# Patient Record
Sex: Female | Born: 1945 | Race: White | Hispanic: No | Marital: Married | Smoking: Never smoker
Health system: Southern US, Community
[De-identification: ages and names within clinical notes are randomized; demographics above are authoritative.]

## PROBLEM LIST (undated history)

## (undated) DIAGNOSIS — K219 Gastro-esophageal reflux disease without esophagitis: Secondary | ICD-10-CM

## (undated) DIAGNOSIS — I739 Peripheral vascular disease, unspecified: Secondary | ICD-10-CM

## (undated) DIAGNOSIS — I10 Essential (primary) hypertension: Secondary | ICD-10-CM

## (undated) DIAGNOSIS — E079 Disorder of thyroid, unspecified: Secondary | ICD-10-CM

## (undated) HISTORY — PX: JOINT REPLACEMENT: SHX530

## (undated) HISTORY — PX: KNEE ARTHROSCOPY: SHX127

## (undated) HISTORY — PX: ROTATOR CUFF REPAIR: SHX139

## (undated) HISTORY — PX: TUBAL LIGATION: SHX77

## (undated) HISTORY — DX: Disorder of thyroid, unspecified: E07.9

## (undated) HISTORY — DX: Peripheral vascular disease, unspecified: I73.9

---

## 2004-03-21 ENCOUNTER — Ambulatory Visit: Payer: Self-pay | Admitting: General Surgery

## 2004-05-04 ENCOUNTER — Ambulatory Visit: Payer: Self-pay

## 2004-06-16 ENCOUNTER — Ambulatory Visit: Payer: Self-pay

## 2005-06-18 ENCOUNTER — Ambulatory Visit: Payer: Self-pay | Admitting: Unknown Physician Specialty

## 2005-12-11 ENCOUNTER — Ambulatory Visit: Payer: Self-pay | Admitting: Obstetrics and Gynecology

## 2006-01-11 ENCOUNTER — Ambulatory Visit: Payer: Self-pay | Admitting: Unknown Physician Specialty

## 2006-12-26 ENCOUNTER — Ambulatory Visit: Payer: Self-pay | Admitting: Obstetrics and Gynecology

## 2007-12-25 ENCOUNTER — Ambulatory Visit: Payer: Self-pay | Admitting: Obstetrics and Gynecology

## 2009-02-09 ENCOUNTER — Ambulatory Visit: Payer: Self-pay | Admitting: Obstetrics and Gynecology

## 2010-02-10 ENCOUNTER — Ambulatory Visit: Payer: Self-pay | Admitting: Family Medicine

## 2011-03-12 ENCOUNTER — Ambulatory Visit: Payer: Self-pay | Admitting: Family Medicine

## 2012-04-24 ENCOUNTER — Ambulatory Visit: Payer: Self-pay | Admitting: Internal Medicine

## 2013-04-27 ENCOUNTER — Ambulatory Visit: Payer: Self-pay | Admitting: Internal Medicine

## 2013-09-08 DIAGNOSIS — M1990 Unspecified osteoarthritis, unspecified site: Secondary | ICD-10-CM | POA: Insufficient documentation

## 2014-01-01 ENCOUNTER — Ambulatory Visit: Payer: Self-pay | Admitting: Gastroenterology

## 2014-04-30 ENCOUNTER — Ambulatory Visit: Payer: Self-pay | Admitting: Internal Medicine

## 2015-03-14 ENCOUNTER — Other Ambulatory Visit: Payer: Self-pay | Admitting: Internal Medicine

## 2015-03-14 DIAGNOSIS — R1031 Right lower quadrant pain: Secondary | ICD-10-CM

## 2015-03-25 ENCOUNTER — Ambulatory Visit
Admission: RE | Admit: 2015-03-25 | Discharge: 2015-03-25 | Disposition: A | Discharge status: Home / Self Care | Payer: Medicare Other | Source: Ambulatory Visit | Attending: Internal Medicine | Admitting: Internal Medicine

## 2015-03-25 DIAGNOSIS — R102 Pelvic and perineal pain: Secondary | ICD-10-CM | POA: Diagnosis present

## 2015-03-25 DIAGNOSIS — R1031 Right lower quadrant pain: Secondary | ICD-10-CM | POA: Diagnosis not present

## 2015-03-25 DIAGNOSIS — I7090 Unspecified atherosclerosis: Secondary | ICD-10-CM | POA: Diagnosis not present

## 2015-03-25 MED ORDER — IOHEXOL 300 MG/ML  SOLN: 100.0000 mL | Freq: Once | INTRAMUSCULAR | Status: DC | PRN

## 2015-04-26 ENCOUNTER — Other Ambulatory Visit: Payer: Self-pay | Admitting: Internal Medicine

## 2015-04-27 ENCOUNTER — Other Ambulatory Visit: Payer: Self-pay | Admitting: Internal Medicine

## 2015-04-27 DIAGNOSIS — Z1231 Encounter for screening mammogram for malignant neoplasm of breast: Secondary | ICD-10-CM

## 2015-05-02 ENCOUNTER — Ambulatory Visit
Admission: RE | Admit: 2015-05-02 | Discharge: 2015-05-02 | Disposition: A | Discharge status: Home / Self Care | Payer: Medicare Other | Source: Ambulatory Visit | Attending: Internal Medicine | Admitting: Internal Medicine

## 2015-05-02 ENCOUNTER — Other Ambulatory Visit: Payer: Self-pay | Admitting: Internal Medicine

## 2015-05-02 DIAGNOSIS — Z1231 Encounter for screening mammogram for malignant neoplasm of breast: Secondary | ICD-10-CM | POA: Insufficient documentation

## 2015-05-03 ENCOUNTER — Other Ambulatory Visit: Payer: Self-pay | Admitting: Internal Medicine

## 2015-05-03 DIAGNOSIS — R928 Other abnormal and inconclusive findings on diagnostic imaging of breast: Secondary | ICD-10-CM

## 2015-05-09 ENCOUNTER — Ambulatory Visit
Admission: RE | Admit: 2015-05-09 | Discharge: 2015-05-09 | Disposition: A | Discharge status: Home / Self Care | Payer: Medicare Other | Source: Ambulatory Visit | Attending: Internal Medicine | Admitting: Internal Medicine

## 2015-05-09 DIAGNOSIS — R928 Other abnormal and inconclusive findings on diagnostic imaging of breast: Secondary | ICD-10-CM | POA: Diagnosis not present

## 2016-04-17 ENCOUNTER — Other Ambulatory Visit: Payer: Self-pay | Admitting: Internal Medicine

## 2016-04-17 DIAGNOSIS — Z1231 Encounter for screening mammogram for malignant neoplasm of breast: Secondary | ICD-10-CM

## 2016-05-16 ENCOUNTER — Ambulatory Visit
Admission: RE | Admit: 2016-05-16 | Discharge: 2016-05-16 | Disposition: A | Discharge status: Home / Self Care | Payer: Medicare Other | Source: Ambulatory Visit | Attending: Internal Medicine | Admitting: Internal Medicine

## 2016-05-16 DIAGNOSIS — Z1231 Encounter for screening mammogram for malignant neoplasm of breast: Secondary | ICD-10-CM | POA: Diagnosis present

## 2017-06-28 ENCOUNTER — Other Ambulatory Visit: Payer: Self-pay | Admitting: Internal Medicine

## 2017-06-28 DIAGNOSIS — Z1231 Encounter for screening mammogram for malignant neoplasm of breast: Secondary | ICD-10-CM

## 2017-08-06 ENCOUNTER — Ambulatory Visit
Admission: RE | Admit: 2017-08-06 | Discharge: 2017-08-06 | Disposition: A | Discharge status: Home / Self Care | Payer: Medicare Other | Source: Ambulatory Visit | Attending: Internal Medicine | Admitting: Internal Medicine

## 2017-08-06 DIAGNOSIS — Z1231 Encounter for screening mammogram for malignant neoplasm of breast: Secondary | ICD-10-CM | POA: Diagnosis present

## 2018-01-17 ENCOUNTER — Ambulatory Visit (INDEPENDENT_AMBULATORY_CARE_PROVIDER_SITE_OTHER): Payer: Medicare Other | Admitting: Vascular Surgery

## 2018-01-17 ENCOUNTER — Encounter (INDEPENDENT_AMBULATORY_CARE_PROVIDER_SITE_OTHER): Payer: Self-pay | Admitting: Vascular Surgery

## 2018-01-17 DIAGNOSIS — I83813 Varicose veins of bilateral lower extremities with pain: Secondary | ICD-10-CM

## 2018-01-17 DIAGNOSIS — I739 Peripheral vascular disease, unspecified: Secondary | ICD-10-CM | POA: Diagnosis not present

## 2018-01-17 NOTE — Assessment & Plan Note (Signed)
Recommend:  The patient has large symptomatic varicose veins that are painful and associated with swelling.  I have had a long discussion with the patient regarding  varicose veins and why they cause symptoms.  Patient will begin wearing graduated compression stockings class 1 on a daily basis, beginning first thing in the morning and removing them in the evening. The patient is instructed specifically not to sleep in the stockings.    The patient  will also begin using over-the-counter analgesics such as Motrin 600 mg po TID to help control the symptoms.    In addition, behavioral modification including elevation during the day will be initiated.    Pending the results of these changes the  patient will be reevaluated allowing her studies.   An  ultrasound of the venous system will be obtained.   Further plans will be based on the ultrasound results and whether conservative therapies are successful at eliminating the pain and swelling.

## 2018-01-17 NOTE — Progress Notes (Signed)
Patient ID: Ann Crawford, female   DOB: [birth year], 72 y.o.   MRN: [id number]  Chief Complaint  Patient presents with  . Follow-up    Bilateral painful legs     HPI Ann Crawford is a 72 y.o. female.  Patient present for evaluation of painful legs.  The patient has had increasing varicosities over several years time.  For many years, her legs were not painful or swollen.  She has noticed several episodes where the legs become heavy and tired.  Last week, she had an episode where she had severe pain on the inside of her right leg starting in her thigh and radiating down towards the foot.  After several days and using anti-inflammatories and aspirin, the pain went away.  She had an aunt who had a DVT and other family members who have had venous disease so she was very concerned about this.  She does not describe claudication symptoms.  She has no history of ulceration or infection.  The right leg is the more severely affected of the 2 legs.  There is no clear inciting event or causative factor that started the symptoms.  Current Outpatient Medications  Medication Sig Dispense Refill  . aspirin 81 MG chewable tablet Chew by mouth daily.    . fluticasone (FLONASE) 50 MCG/ACT nasal spray Place into both nostrils daily.    Marland Kitchen glucosamine-chondroitin 500-400 MG tablet Take 1 tablet by mouth 3 (three) times daily.    Marland Kitchen levothyroxine (SYNTHROID, LEVOTHROID) 50 MCG tablet Take by mouth.    . Na Sulfate-K Sulfate-Mg Sulf 17.5-3.13-1.6 GM/177ML SOLN Take as directed for colon prep.    . Selenium 200 MCG CAPS Take by mouth.     No current facility-administered medications for this visit.      Past Medical History:  Diagnosis Date  . Peripheral arterial disease (HCC)   . Thyroid disease     Past Surgical History:  Procedure Laterality Date  . JOINT REPLACEMENT    . TUBAL LIGATION      Family History  Problem Relation Age of Onset  . Hypertension Mother   . Varicose Veins Paternal  Aunt   . Breast cancer Neg Hx   DVT in a paternal aunt No bleeding disorders  Social History Social History   Tobacco Use  . Smoking status: Never Smoker  . Smokeless tobacco: Never Used  Substance Use Topics  . Alcohol use: Yes  . Drug use: Not on file    Allergies  Allergen Reactions  . Latex Rash        REVIEW OF SYSTEMS (Negative unless checked)  Constitutional: [] Weight loss  [] Fever  [] Chills Cardiac: [] Chest pain   [] Chest pressure   [] Palpitations   [] Shortness of breath when laying flat   [] Shortness of breath at rest   [] Shortness of breath with exertion. Vascular:  [x] Pain in legs with walking   [x] Pain in legs at rest   [] Pain in legs when laying flat   [] Claudication   [] Pain in feet when walking  [] Pain in feet at rest  [] Pain in feet when laying flat   [] History of DVT   [] Phlebitis   [x] Swelling in legs   [x] Varicose veins   [] Non-healing ulcers Pulmonary:   [] Uses home oxygen   [] Productive cough   [] Hemoptysis   [] Wheeze  [] COPD   [] Asthma Neurologic:  [] Dizziness  [] Blackouts   [] Seizures   [] History of stroke   [] History of TIA  [] Aphasia   [] Temporary  blindness   [] Dysphagia   [] Weakness or numbness in arms   [] Weakness or numbness in legs Musculoskeletal:  [] Arthritis   [] Joint swelling   [] Joint pain   [] Low back pain Hematologic:  [] Easy bruising  [] Easy bleeding   [] Hypercoagulable state   [] Anemic  [] Hepatitis  Gastrointestinal:  [] Blood in stool   [] Vomiting blood  [] Gastroesophageal reflux/heartburn   [] Difficulty swallowing   Genitourinary:  [] Chronic kidney disease   [] Difficult urination  [] Frequent urination  [] Burning with urination   [] Blood in urine Skin:  [] Rashes   [] Ulcers   [] Wounds Psychological:  [] History of anxiety   []  History of major depression.  Physical Exam BP (!) 149/85 (BP Location: Right Arm, Patient Position: Sitting)   Pulse 89   Resp 17   Ht 5\' 3"  (1.6 m)   Wt 159 lb (72.1 kg)   BMI 28.17 kg/m   Gen:  WD/WN, NAD.   Appears younger than stated age Head: [place]/AT, No temporalis wasting.  Ear/Nose/Throat: Hearing grossly intact, nares w/o erythema or drainage, oropharynx w/o Erythema/Exudate Eyes: Sclera non-icteric, conjunctiva clear Neck: Trachea midline.  No JVD.  Pulmonary:  Good air movement, no use of accessory muscles.  Cardiac: RRR, normal S1, S2. Vascular:  Vessel Right Left  Radial Palpable Palpable                          PT  1+ palpable  1+ palpable  DP Palpable Palpable    Musculoskeletal: M/S 5/5 throughout.  Extremities without ischemic changes.  No deformity or atrophy.  Trace right lower extremity edema. Varicosities diffuse bilaterally a little worse on the right than the left.  The largest varicosities are in the 2 mm range Neurologic:  Sensation grossly intact in extremities.  Symmetrical.  Speech is fluent. Motor exam as listed above. Psychiatric: Judgment intact, Mood & affect appropriate for pt's clinical situation. Dermatologic: No rashes or ulcers noted.  No cellulitis or open wounds.   Radiology No results found.  Labs No results found for this or any previous visit (from the past 2160 hour(s)).  Assessment/Plan:  Peripheral arterial disease (HCC) This is listed as 1 of her medical problems.  Her symptoms are not really consistent with peripheral arterial disease and are more worrisome for venous disease.  Varicose veins of leg with pain, bilateral  Recommend:  The patient has large symptomatic varicose veins that are painful and associated with swelling.  I have had a long discussion with the patient regarding  varicose veins and why they cause symptoms.  Patient will begin wearing graduated compression stockings class 1 on a daily basis, beginning first thing in the morning and removing them in the evening. The patient is instructed specifically not to sleep in the stockings.    The patient  will also begin using over-the-counter analgesics such as Motrin 600  mg po TID to help control the symptoms.    In addition, behavioral modification including elevation during the day will be initiated.    Pending the results of these changes the  patient will be reevaluated allowing her studies.   An  ultrasound of the venous system will be obtained.   Further plans will be based on the ultrasound results and whether conservative therapies are successful at eliminating the pain and swelling.      Festus Barren 01/17/2018, 4:35 PM   This note was created with [facility] medical dictation system.  Any errors from dictation are unintentional.

## 2018-01-17 NOTE — Assessment & Plan Note (Signed)
This is listed as 1 of her medical problems.  Her symptoms are not really consistent with peripheral arterial disease and are more worrisome for venous disease.

## 2018-01-17 NOTE — Patient Instructions (Signed)

## 2018-02-12 ENCOUNTER — Ambulatory Visit (INDEPENDENT_AMBULATORY_CARE_PROVIDER_SITE_OTHER): Payer: Medicare Other | Admitting: Nurse Practitioner

## 2018-02-12 ENCOUNTER — Encounter (INDEPENDENT_AMBULATORY_CARE_PROVIDER_SITE_OTHER): Payer: Self-pay | Admitting: Nurse Practitioner

## 2018-02-12 ENCOUNTER — Ambulatory Visit (INDEPENDENT_AMBULATORY_CARE_PROVIDER_SITE_OTHER): Payer: Medicare Other

## 2018-02-12 VITALS — BP 149/84 | HR 78 | Resp 16 | Ht 63.0 in | Wt 160.0 lb

## 2018-02-12 DIAGNOSIS — I83813 Varicose veins of bilateral lower extremities with pain: Secondary | ICD-10-CM

## 2018-02-12 DIAGNOSIS — I739 Peripheral vascular disease, unspecified: Secondary | ICD-10-CM | POA: Diagnosis not present

## 2018-02-12 DIAGNOSIS — M1710 Unilateral primary osteoarthritis, unspecified knee: Secondary | ICD-10-CM | POA: Diagnosis not present

## 2018-02-13 ENCOUNTER — Encounter (INDEPENDENT_AMBULATORY_CARE_PROVIDER_SITE_OTHER): Payer: Self-pay | Admitting: Nurse Practitioner

## 2018-02-13 NOTE — Progress Notes (Signed)
Subjective:    Patient ID: Ann Crawford, female    DOB: [birth year], 72 y.o.   MRN: [id number] Chief Complaint  Patient presents with  . Follow-up    ultrasound follow up    HPI  Ann Crawford is a 72 y.o. female is following up today for concerns of increasing varicose veins with pain in the lateral aspect of her right thigh.  She states that the pain starts in her thigh radiates down for several days and it is controlled with aspirin.  Patient was also concerned due to the fact that multiple family members have had DVTs in the past.  She denies any claudication-like symptoms or rest pain.  She denies any ulcerations or wounds on her bilateral lower extremities.  There is no history of venous or peripheral vascular intervention.  She denies any fever, chills, nausea, vomiting.  There is a history of osteoarthritis in the hip and knee.  She denies any chest pain or shortness of breath.  The patient underwent a bilateral lower venous reflux study today which revealed no evidence of DVT in the bilateral lower extremities.  There is no evidence of chronic venous insufficiency bilaterally.  There is no evidence of superficial venous thrombosis bilaterally.  Past Medical History:  Diagnosis Date  . Peripheral arterial disease (HCC)   . Thyroid disease     Past Surgical History:  Procedure Laterality Date  . JOINT REPLACEMENT    . TUBAL LIGATION      Social History   Socioeconomic History  . Marital status: Married    Spouse name: Not on file  . Number of children: Not on file  . Years of education: Not on file  . Highest education level: Not on file  Occupational History  . Not on file  Social Needs  . Financial resource strain: Not on file  . Food insecurity:    Worry: Not on file    Inability: Not on file  . Transportation needs:    Medical: Not on file    Non-medical: Not on file  Tobacco Use  . Smoking status: Never Smoker  . Smokeless tobacco: Never Used    Substance and Sexual Activity  . Alcohol use: Yes  . Drug use: Not on file  . Sexual activity: Not on file  Lifestyle  . Physical activity:    Days per week: Not on file    Minutes per session: Not on file  . Stress: Not on file  Relationships  . Social connections:    Talks on phone: Not on file    Gets together: Not on file    Attends religious service: Not on file    Active member of club or organization: Not on file    Attends meetings of clubs or organizations: Not on file    Relationship status: Not on file  . Intimate partner violence:    Fear of current or ex partner: Not on file    Emotionally abused: Not on file    Physically abused: Not on file    Forced sexual activity: Not on file  Other Topics Concern  . Not on file  Social History Narrative  . Not on file    Family History  Problem Relation Age of Onset  . Hypertension Mother   . Varicose Veins Paternal Aunt   . Breast cancer Neg Hx     Allergies  Allergen Reactions  . Latex Rash     Review of Systems  Review of Systems: Negative Unless Checked Constitutional: [] Weight loss  [] Fever  [] Chills Cardiac: [] Chest pain   []  Atrial Fibrillation  [] Palpitations   [] Shortness of breath when laying flat   [] Shortness of breath with exertion. Vascular:  [] Pain in legs with walking   [] Pain in legs with standing  [] History of DVT   [] Phlebitis   [x] Swelling in legs   [x] Varicose veins   [] Non-healing ulcers Pulmonary:   [] Uses home oxygen   [] Productive cough   [] Hemoptysis   [] Wheeze  [] COPD   [] Asthma Neurologic:  [] Dizziness   [] Seizures   [] History of stroke   [] History of TIA  [] Aphasia   [] Vissual changes   [] Weakness or numbness in arm   [] Weakness or numbness in leg Musculoskeletal:   [] Joint swelling   [x] Joint pain   [] Low back pain  []  History of Knee Replacement Hematologic:  [] Easy bruising  [] Easy bleeding   [] Hypercoagulable state   [] Anemic Gastrointestinal:  [] Diarrhea   [] Vomiting   [] Gastroesophageal reflux/heartburn   [] Difficulty swallowing. Genitourinary:  [] Chronic kidney disease   [] Difficult urination  [] Anuric   [] Blood in urine Skin:  [] Rashes   [] Ulcers  Psychological:  [] History of anxiety   []  History of major depression  []  Memory Difficulties     Objective:   Physical Exam  BP (!) 149/84 (BP Location: Right Arm)   Pulse 78   Resp 16   Ht 5\' 3"  (1.6 m)   Wt 160 lb (72.6 kg)   BMI 28.34 kg/m   Gen: WD/WN, NAD Head: [place]/AT, No temporalis wasting.  Ear/Nose/Throat: Hearing grossly intact, nares w/o erythema or drainage Eyes: PER, EOMI, sclera nonicteric.  Neck: Supple, no masses.  No JVD.  Pulmonary:  Good air movement, no use of accessory muscles.  Cardiac: RRR Vascular:  Audible scattered varicosities on the bilateral lower extremities Vessel Right Left  Radial Palpable Palpable  Dorsalis Pedis Palpable Palpable  Posterior Tibial Palpable Palpable   Gastrointestinal: soft, non-distended. No guarding/no peritoneal signs.  Musculoskeletal: M/S 5/5 throughout.  No deformity or atrophy.  Neurologic: Pain and light touch intact in extremities.  Symmetrical.  Speech is fluent. Motor exam as listed above. Psychiatric: Judgment intact, Mood & affect appropriate for pt's clinical situation. Dermatologic: No Venous rashes. No Ulcers Noted.  No changes consistent with cellulitis. Lymph : No Cervical lymphadenopathy, no lichenification or skin changes of chronic lymphedema.      Assessment & Plan:    1. Varicose veins of leg with pain, bilateral The patient underwent a bilateral lower venous reflux study today which revealed no evidence of DVT in the bilateral lower extremities.  There is no evidence of chronic venous insufficiency bilaterally.  There is no evidence of superficial venous thrombosis bilaterally.  Recommend:  The patient is complaining of varicose veins.    I have had a long discussion with the patient regarding  varicose veins and  why they cause symptoms.  Patient will begin wearing graduated compression stockings on a daily basis, beginning first thing in the morning and removing them in the evening. The patient is instructed specifically not to sleep in the stockings.    The patient  will also begin using over-the-counter analgesics such as Motrin 600 mg po TID to help control the symptoms as needed.    In addition, behavioral modification including elevation during the day will be initiated, utilizing a recliner was recommended.  The patient is also instructed to continue exercising such as walking 4-5 times per week.  At this time the patient wishes to continue conservative therapy and is not interested in more invasive treatments such as laser ablation and sclerotherapy.  The Patient will follow up PRN if the symptoms worsen.  2. Peripheral arterial disease (HCC) The patient does not have pain and symptoms significant peripheral vascular disease.  The patient also does not have a risk factor for peripheral vascular disease.  3. Primary osteoarthritis of knee, unspecified laterality Continue NSAID medications as already ordered, these medications have been reviewed and there are no changes at this time.  Continued activity and therapy was stressed.    Current Outpatient Medications on File Prior to Visit  Medication Sig Dispense Refill  . aspirin 81 MG chewable tablet Chew by mouth daily.    . fluticasone (FLONASE) 50 MCG/ACT nasal spray Place into both nostrils daily.    Marland Kitchen glucosamine-chondroitin 500-400 MG tablet Take 1 tablet by mouth 3 (three) times daily.    Marland Kitchen levothyroxine (SYNTHROID, LEVOTHROID) 50 MCG tablet Take by mouth.    . Na Sulfate-K Sulfate-Mg Sulf 17.5-3.13-1.6 GM/177ML SOLN Take as directed for colon prep.    . Selenium 200 MCG CAPS Take by mouth.     No current facility-administered medications on file prior to visit.     There are no Patient Instructions on file for this visit. Return  if symptoms worsen or fail to improve.   Georgiana Spinner, NP  This note was completed with Office manager.  Any errors are purely unintentional.

## 2018-04-03 ENCOUNTER — Other Ambulatory Visit: Payer: Self-pay | Admitting: Internal Medicine

## 2018-04-03 DIAGNOSIS — R1084 Generalized abdominal pain: Secondary | ICD-10-CM

## 2018-04-11 ENCOUNTER — Ambulatory Visit
Admission: RE | Admit: 2018-04-11 | Discharge: 2018-04-11 | Disposition: A | Discharge status: Home / Self Care | Payer: Medicare Other | Source: Ambulatory Visit | Attending: Internal Medicine | Admitting: Internal Medicine

## 2018-04-11 DIAGNOSIS — R1084 Generalized abdominal pain: Secondary | ICD-10-CM | POA: Diagnosis present

## 2018-12-10 ENCOUNTER — Other Ambulatory Visit: Payer: Self-pay | Admitting: Student

## 2018-12-10 DIAGNOSIS — R112 Nausea with vomiting, unspecified: Secondary | ICD-10-CM

## 2018-12-18 ENCOUNTER — Ambulatory Visit
Admission: RE | Admit: 2018-12-18 | Discharge: 2018-12-18 | Disposition: A | Discharge status: Home / Self Care | Payer: Medicare Other | Source: Ambulatory Visit | Attending: Student | Admitting: Student

## 2018-12-18 ENCOUNTER — Other Ambulatory Visit: Payer: Self-pay

## 2018-12-18 DIAGNOSIS — R112 Nausea with vomiting, unspecified: Secondary | ICD-10-CM

## 2018-12-18 MED ORDER — TECHNETIUM TC 99M MEBROFENIN IV KIT
5.2300 | PACK | Freq: Once | INTRAVENOUS | Status: AC | PRN
  Administered 2018-12-18: 5.23 via INTRAVENOUS

## 2018-12-23 ENCOUNTER — Other Ambulatory Visit: Payer: Self-pay | Admitting: Student

## 2018-12-23 DIAGNOSIS — R112 Nausea with vomiting, unspecified: Secondary | ICD-10-CM

## 2018-12-25 ENCOUNTER — Other Ambulatory Visit: Payer: Self-pay

## 2018-12-25 ENCOUNTER — Encounter
Admission: RE | Admit: 2018-12-25 | Discharge: 2018-12-25 | Disposition: A | Discharge status: Home / Self Care | Payer: Medicare Other | Source: Ambulatory Visit | Attending: Student | Admitting: Student

## 2018-12-25 DIAGNOSIS — R112 Nausea with vomiting, unspecified: Secondary | ICD-10-CM | POA: Insufficient documentation

## 2018-12-25 MED ORDER — TECHNETIUM TC 99M SULFUR COLLOID
2.7900 | Freq: Once | INTRAVENOUS | Status: AC | PRN
  Administered 2018-12-25: 2.79 via INTRAVENOUS

## 2019-02-12 ENCOUNTER — Other Ambulatory Visit: Payer: Self-pay | Admitting: Internal Medicine

## 2019-02-12 DIAGNOSIS — Z1231 Encounter for screening mammogram for malignant neoplasm of breast: Secondary | ICD-10-CM

## 2019-05-11 ENCOUNTER — Ambulatory Visit
Admission: RE | Admit: 2019-05-11 | Discharge: 2019-05-11 | Disposition: A | Discharge status: Home / Self Care | Payer: Medicare Other | Source: Ambulatory Visit | Attending: Internal Medicine | Admitting: Internal Medicine

## 2019-05-11 DIAGNOSIS — Z1231 Encounter for screening mammogram for malignant neoplasm of breast: Secondary | ICD-10-CM | POA: Diagnosis not present

## 2020-07-08 ENCOUNTER — Other Ambulatory Visit: Payer: Self-pay | Admitting: Internal Medicine

## 2020-07-08 DIAGNOSIS — Z1231 Encounter for screening mammogram for malignant neoplasm of breast: Secondary | ICD-10-CM

## 2020-07-18 ENCOUNTER — Inpatient Hospital Stay: Admission: RE | Admit: 2020-07-18 | Payer: Medicare Other | Source: Ambulatory Visit

## 2020-07-26 IMAGING — NM NM GASTRIC EMPTYING
1 series · 10 of 10 positions shown · non-contrast
Comparison: None

CLINICAL DATA: Nausea, vomiting, bloating, and postprandial
fullness, occasional abdominal pain

EXAM:
NUCLEAR MEDICINE GASTRIC EMPTYING SCAN
TECHNIQUE: After oral ingestion of radiolabeled exam 4 ounces of water,
sequential abdominal images were obtained for 4 hours. Patient did
not eat the full standardized meal, did not consume the toast.
Percentage of activity emptying the stomach was calculated at 1
hour, 2 hour, 3 hour, and 4 hours.
RADIOPHARMACEUTICALS:  2.79 mCi Tc-66m sulfur colloid in
standardized meal

[Series 1000: gatric statics (results) · 3.90mm/px · 5 acquisitions, 10 frames shown]
[im 1/5]
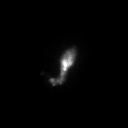
[im 1/5]
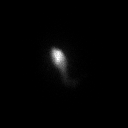
[im 2/5]
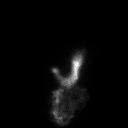
[im 2/5]
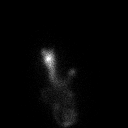
[im 3/5]
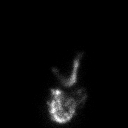
[im 3/5]
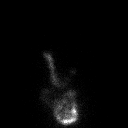
[im 4/5]
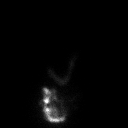
[im 4/5]
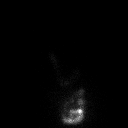
[im 5/5]
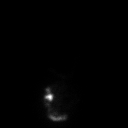
[im 5/5]
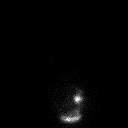

[10 of 10 positions shown; findings below may reference images not displayed]

FINDINGS: Expected location of the stomach in the left upper quadrant.

Ingested meal empties the stomach gradually over the course of the
study.

43% emptied at 1 hr ( normal >= 10%)

81% emptied at 2 hr ( normal >= 40%)

93% emptied at 3 hr ( normal >= 70%)

98% emptied at 4 hr ( normal >= 90%)
IMPRESSION: Normal gastric emptying study.

## 2020-08-04 ENCOUNTER — Other Ambulatory Visit: Payer: Self-pay

## 2020-08-04 ENCOUNTER — Ambulatory Visit
Admission: RE | Admit: 2020-08-04 | Discharge: 2020-08-04 | Disposition: A | Discharge status: Home / Self Care | Payer: Medicare Other | Source: Ambulatory Visit | Attending: Internal Medicine | Admitting: Internal Medicine

## 2020-08-04 DIAGNOSIS — Z1231 Encounter for screening mammogram for malignant neoplasm of breast: Secondary | ICD-10-CM | POA: Diagnosis present

## 2021-03-24 ENCOUNTER — Other Ambulatory Visit: Payer: Self-pay | Admitting: Orthopedic Surgery

## 2021-03-24 DIAGNOSIS — M25562 Pain in left knee: Secondary | ICD-10-CM

## 2021-03-29 ENCOUNTER — Other Ambulatory Visit: Payer: Self-pay

## 2021-03-29 ENCOUNTER — Ambulatory Visit
Admission: RE | Admit: 2021-03-29 | Discharge: 2021-03-29 | Disposition: A | Discharge status: Home / Self Care | Payer: Medicare Other | Source: Ambulatory Visit | Attending: Orthopedic Surgery | Admitting: Orthopedic Surgery

## 2021-03-29 DIAGNOSIS — M25562 Pain in left knee: Secondary | ICD-10-CM | POA: Insufficient documentation

## 2021-04-14 ENCOUNTER — Other Ambulatory Visit: Payer: Self-pay | Admitting: Orthopedic Surgery

## 2021-04-20 ENCOUNTER — Encounter
Admission: RE | Admit: 2021-04-20 | Discharge: 2021-04-20 | Disposition: A | Discharge status: Home / Self Care | Payer: Medicare Other | Source: Ambulatory Visit | Attending: Orthopedic Surgery | Admitting: Orthopedic Surgery

## 2021-04-20 ENCOUNTER — Other Ambulatory Visit
Admission: RE | Admit: 2021-04-20 | Discharge: 2021-04-20 | Disposition: A | Discharge status: Home / Self Care | Payer: Medicare Other | Source: Ambulatory Visit | Attending: Orthopedic Surgery | Admitting: Orthopedic Surgery

## 2021-04-20 ENCOUNTER — Other Ambulatory Visit: Payer: Self-pay

## 2021-04-20 DIAGNOSIS — Z01810 Encounter for preprocedural cardiovascular examination: Secondary | ICD-10-CM | POA: Diagnosis present

## 2021-04-20 HISTORY — DX: Gastro-esophageal reflux disease without esophagitis: K21.9

## 2021-04-20 HISTORY — DX: Essential (primary) hypertension: I10

## 2021-04-20 NOTE — Patient Instructions (Addendum)
Your procedure is scheduled on: Tue 04/25/21 Report to DAY SURGERY DEPARTMENT LOCATED ON 2ND FLOOR MEDICAL MALL ENTRANCE. To find out your arrival time please call [phone] between 1PM - 3PM on Mon 04/24/21.  Remember: Instructions that are not followed completely may result in serious medical risk, up to and including death, or upon the discretion of your surgeon and anesthesiologist your surgery may need to be rescheduled.     _X__ 1. Do not eat food after midnight the night before your procedure.                 No gum chewing or hard candies. You may drink clear liquids up to 2 hours                 before you are scheduled to arrive for your surgery- DO not drink clear                 liquids within 2 hours of the start of your surgery.                 Clear Liquids include:  water, apple juice without pulp, clear carbohydrate                 drink such as Clearfast or Gatorade, Black Coffee or Tea (Do not add                 anything to coffee or tea). Diabetics water only  Drink the Ensure "Clear" Pre Surgery drink 2 hours before arriving for surgery  __X__2.  On the morning of surgery brush your teeth with toothpaste and water, you                 may rinse your mouth with mouthwash if you wish.  Do not swallow any              toothpaste of mouthwash.     _X__ 3.  No Alcohol for 24 hours before or after surgery.   _X__ 4.  Do Not Smoke or use e-cigarettes For 24 Hours Prior to Your Surgery.                 Do not use any chewable tobacco products for at least 6 hours prior to                 surgery.  ____  5.  Bring all medications with you on the day of surgery if instructed.   __X__  6.  Notify your doctor if there is any change in your medical condition      (cold, fever, infections).     Do not wear jewelry, make-up, hairpins, clips or nail polish. Do not wear lotions, powders, or perfumes.  Do not shave body hair 48 hours prior to surgery. Men may shave face and  neck. Do not bring valuables to the hospital.    [facility] is not responsible for any belongings or valuables.  Contacts, dentures/partials or body piercings may not be worn into surgery. Bring a case for your contacts, glasses or hearing aids, a denture cup will be supplied. Leave your suitcase in the car. After surgery it may be brought to your room. For patients admitted to the hospital, discharge time is determined by your treatment team.   Patients discharged the day of surgery will not be allowed to drive home.   Please read over the following fact sheets that you were given:  CHG soap, Incentive Spirometer, Ensure drink  __X__ Take these medicines the morning of surgery with A SIP OF WATER:    1. metoprolol succinate (TOPROL-XL) 25 MG 24 hr tablet  2. levothyroxine (SYNTHROID) 75 MCG tablet  3. pantoprazole (PROTONIX) 20 MG tablet  4. cetirizine (ZYRTEC) 10 MG tablet  5.  6.  ____ Fleet Enema (as directed)   __X__ Use CHG Soap/SAGE wipes as directed  ____ Use inhalers on the day of surgery  ____ Stop metformin/Janumet/Farxiga 2 days prior to surgery    ____ Take 1/2 of usual insulin dose the night before surgery. No insulin the morning          of surgery.   ____ Stop Blood Thinners Coumadin/Plavix/Xarelto/Pleta/Pradaxa/Eliquis/Effient/Aspirin  on   Or contact your Surgeon, Cardiologist or Medical Doctor regarding  ability to stop your blood thinners  __X__ Stop Anti-inflammatories 7 days before surgery such as Advil, Ibuprofen, Motrin,  BC or Goodies Powder, Naprosyn, Naproxen, Aleve, Aspirin   Tylenol is OK to continue  __X__ Stop all herbal supplements, fish oil or vitamin E until after surgery.    ____ Bring C-Pap to the hospital.      How to Use an Incentive Spirometer An incentive spirometer is a tool that measures how well you are filling your lungs with each breath. Learning to take long, deep breaths using this tool can help you keep your lungs clear  and active. This may help to reverse or lessen your chance of developing breathing (pulmonary) problems, especially infection. You may be asked to use a spirometer: After a surgery. If you have a lung problem or a history of smoking. After a long period of time when you have been unable to move or be active. If the spirometer includes an indicator to show the highest number that you have reached, your health care provider or respiratory therapist will help you set a goal. Keep a log of your progress as told by your health care provider. What are the risks? Breathing too quickly may cause dizziness or cause you to pass out. Take your time so you do not get dizzy or light-headed. If you are in pain, you may need to take pain medicine before doing incentive spirometry. It is harder to take a deep breath if you are having pain. How to use your incentive spirometer  Sit up on the edge of your bed or on a chair. Hold the incentive spirometer so that it is in an upright position. Before you use the spirometer, breathe out normally. Place the mouthpiece in your mouth. Make sure your lips are closed tightly around it. Breathe in slowly and as deeply as you can through your mouth, causing the piston or the ball to rise toward the top of the chamber. Hold your breath for 3-5 seconds, or for as long as possible. If the spirometer includes a coach indicator, use this to guide you in breathing. Slow down your breathing if the indicator goes above the marked areas. Remove the mouthpiece from your mouth and breathe out normally. The piston or ball will return to the bottom of the chamber. Rest for a few seconds, then repeat the steps 10 or more times. Take your time and take a few normal breaths between deep breaths so that you do not get dizzy or light-headed. Do this every 1-2 hours when you are awake. If the spirometer includes a goal marker to show the highest number you have reached (best effort), use this  as a goal to work toward during each repetition. After each set of 10 deep breaths, cough a few times. This will help to make sure that your lungs are clear. If you have an incision on your chest or abdomen from surgery, place a pillow or a rolled-up towel firmly against the incision when you cough. This can help to reduce pain while taking deep breaths and coughing. General tips When you are able to get out of bed: Walk around often. Continue to take deep breaths and cough in order to clear your lungs. Keep using the incentive spirometer until your health care provider says it is okay to stop using it. If you have been in the hospital, you may be told to keep using the spirometer at home. Contact a health care provider if: You are having difficulty using the spirometer. You have trouble using the spirometer as often as instructed. Your pain medicine is not giving enough relief for you to use the spirometer as told. You have a fever. Get help right away if: You develop shortness of breath. You develop a cough with bloody mucus from the lungs. You have fluid or blood coming from an incision site after you cough. Summary An incentive spirometer is a tool that can help you learn to take long, deep breaths to keep your lungs clear and active. You may be asked to use a spirometer after a surgery, if you have a lung problem or a history of smoking, or if you have been inactive for a long period of time. Use your incentive spirometer as instructed every 1-2 hours while you are awake. If you have an incision on your chest or abdomen, place a pillow or a rolled-up towel firmly against your incision when you cough. This will help to reduce pain. Get help right away if you have shortness of breath, you cough up bloody mucus, or blood comes from your incision when you cough. This information is not intended to replace advice given to you by your health care provider. Make sure you discuss any questions  you have with your health care provider. Document Revised: 06/22/2019 Document Reviewed: 06/22/2019 Elsevier Patient Education  2022 [employer].

## 2021-04-25 ENCOUNTER — Ambulatory Visit: Payer: Medicare Other | Admitting: Certified Registered"

## 2021-04-25 ENCOUNTER — Ambulatory Visit
Admission: RE | Admit: 2021-04-25 | Discharge: 2021-04-25 | Disposition: A | Discharge status: Home / Self Care | Payer: Medicare Other | Attending: Orthopedic Surgery | Admitting: Orthopedic Surgery

## 2021-04-25 ENCOUNTER — Ambulatory Visit: Payer: Medicare Other

## 2021-04-25 ENCOUNTER — Other Ambulatory Visit: Payer: Self-pay

## 2021-04-25 ENCOUNTER — Encounter: Payer: Self-pay | Admitting: Orthopedic Surgery

## 2021-04-25 ENCOUNTER — Encounter
Admission: RE | Disposition: A | Discharge status: Home / Self Care | Payer: Self-pay | Source: Home / Self Care | Attending: Orthopedic Surgery

## 2021-04-25 DIAGNOSIS — Z419 Encounter for procedure for purposes other than remedying health state, unspecified: Secondary | ICD-10-CM

## 2021-04-25 DIAGNOSIS — Z79899 Other long term (current) drug therapy: Secondary | ICD-10-CM | POA: Insufficient documentation

## 2021-04-25 DIAGNOSIS — M1712 Unilateral primary osteoarthritis, left knee: Secondary | ICD-10-CM | POA: Insufficient documentation

## 2021-04-25 DIAGNOSIS — X58XXXA Exposure to other specified factors, initial encounter: Secondary | ICD-10-CM | POA: Insufficient documentation

## 2021-04-25 DIAGNOSIS — K219 Gastro-esophageal reflux disease without esophagitis: Secondary | ICD-10-CM | POA: Insufficient documentation

## 2021-04-25 DIAGNOSIS — S83232A Complex tear of medial meniscus, current injury, left knee, initial encounter: Secondary | ICD-10-CM | POA: Diagnosis not present

## 2021-04-25 DIAGNOSIS — I10 Essential (primary) hypertension: Secondary | ICD-10-CM | POA: Diagnosis not present

## 2021-04-25 DIAGNOSIS — E039 Hypothyroidism, unspecified: Secondary | ICD-10-CM | POA: Diagnosis not present

## 2021-04-25 DIAGNOSIS — I739 Peripheral vascular disease, unspecified: Secondary | ICD-10-CM | POA: Diagnosis not present

## 2021-04-25 DIAGNOSIS — Z9889 Other specified postprocedural states: Secondary | ICD-10-CM | POA: Insufficient documentation

## 2021-04-25 HISTORY — PX: KNEE ARTHROSCOPY WITH SUBCHONDROPLASTY: SHX6732

## 2021-04-25 HISTORY — PX: KNEE ARTHROSCOPY WITH MEDIAL MENISECTOMY: SHX5651

## 2021-04-25 SURGERY — ARTHROSCOPY, KNEE, WITH SUBCHONDROPLASTY
Anesthesia: General | Site: Knee | Laterality: Left

## 2021-04-25 MED ORDER — CHLORHEXIDINE GLUCONATE 0.12 % MT SOLN
OROMUCOSAL | Status: AC
  Administered 2021-04-25: 15 mL via OROMUCOSAL
  Filled 2021-04-25: qty 15

## 2021-04-25 MED ORDER — BUPIVACAINE-EPINEPHRINE (PF) 0.5% -1:200000 IJ SOLN
INTRAMUSCULAR | Status: AC
  Filled 2021-04-25: qty 30

## 2021-04-25 MED ORDER — KETOROLAC TROMETHAMINE 30 MG/ML IJ SOLN
INTRAMUSCULAR | Status: AC
  Filled 2021-04-25: qty 1

## 2021-04-25 MED ORDER — ONDANSETRON HCL 4 MG/2ML IJ SOLN
4.0000 mg | Freq: Four times a day (QID) | INTRAMUSCULAR | Status: DC | PRN
  Administered 2021-04-25: 4 mg via INTRAVENOUS

## 2021-04-25 MED ORDER — PROPOFOL 10 MG/ML IV BOLUS
INTRAVENOUS | Status: DC | PRN
  Administered 2021-04-25: 40 mg via INTRAVENOUS
  Administered 2021-04-25: 110 mg via INTRAVENOUS

## 2021-04-25 MED ORDER — SODIUM CHLORIDE 0.9 % IR SOLN
Status: DC | PRN
  Administered 2021-04-25 (×2): 3000 mL

## 2021-04-25 MED ORDER — KETOROLAC TROMETHAMINE 15 MG/ML IJ SOLN
15.0000 mg | Freq: Once | INTRAMUSCULAR | Status: AC
  Administered 2021-04-25: 15 mg via INTRAVENOUS

## 2021-04-25 MED ORDER — ACETAMINOPHEN 10 MG/ML IV SOLN
INTRAVENOUS | Status: DC | PRN
  Administered 2021-04-25: 1000 mg via INTRAVENOUS

## 2021-04-25 MED ORDER — HYDROCODONE-ACETAMINOPHEN 5-325 MG PO TABS: 1.0000 | ORAL_TABLET | Freq: Four times a day (QID) | ORAL | 0 refills | Status: AC | PRN

## 2021-04-25 MED ORDER — METOCLOPRAMIDE HCL 5 MG/ML IJ SOLN
5.0000 mg | Freq: Three times a day (TID) | INTRAMUSCULAR | Status: DC | PRN
  Administered 2021-04-25: 10 mg via INTRAVENOUS

## 2021-04-25 MED ORDER — LACTATED RINGERS IV SOLN: INTRAVENOUS | Status: DC

## 2021-04-25 MED ORDER — FENTANYL CITRATE (PF) 100 MCG/2ML IJ SOLN
INTRAMUSCULAR | Status: DC | PRN
  Administered 2021-04-25 (×2): 50 ug via INTRAVENOUS

## 2021-04-25 MED ORDER — CHLORHEXIDINE GLUCONATE 0.12 % MT SOLN: 15.0000 mL | Freq: Once | OROMUCOSAL | Status: AC

## 2021-04-25 MED ORDER — OXYCODONE HCL 5 MG/5ML PO SOLN: 5.0000 mg | Freq: Once | ORAL | Status: AC | PRN

## 2021-04-25 MED ORDER — CEFAZOLIN SODIUM-DEXTROSE 2-4 GM/100ML-% IV SOLN
2.0000 g | INTRAVENOUS | Status: AC
  Administered 2021-04-25: 2 g via INTRAVENOUS

## 2021-04-25 MED ORDER — METOCLOPRAMIDE HCL 5 MG/ML IJ SOLN
INTRAMUSCULAR | Status: AC
  Filled 2021-04-25: qty 2

## 2021-04-25 MED ORDER — DEXAMETHASONE SODIUM PHOSPHATE 10 MG/ML IJ SOLN
INTRAMUSCULAR | Status: DC | PRN
  Administered 2021-04-25: 4 mg via INTRAVENOUS

## 2021-04-25 MED ORDER — MIDAZOLAM HCL 2 MG/2ML IJ SOLN
INTRAMUSCULAR | Status: DC | PRN
  Administered 2021-04-25: 2 mg via INTRAVENOUS

## 2021-04-25 MED ORDER — LIDOCAINE HCL (PF) 2 % IJ SOLN
INTRAMUSCULAR | Status: AC
  Filled 2021-04-25: qty 5

## 2021-04-25 MED ORDER — ONDANSETRON HCL 4 MG PO TABS: 4.0000 mg | ORAL_TABLET | Freq: Four times a day (QID) | ORAL | Status: DC | PRN

## 2021-04-25 MED ORDER — LIDOCAINE HCL (CARDIAC) PF 100 MG/5ML IV SOSY
PREFILLED_SYRINGE | INTRAVENOUS | Status: DC | PRN
  Administered 2021-04-25: 80 mg via INTRAVENOUS

## 2021-04-25 MED ORDER — ORAL CARE MOUTH RINSE: 15.0000 mL | Freq: Once | OROMUCOSAL | Status: AC

## 2021-04-25 MED ORDER — METOCLOPRAMIDE HCL 10 MG PO TABS: 5.0000 mg | ORAL_TABLET | Freq: Three times a day (TID) | ORAL | Status: DC | PRN

## 2021-04-25 MED ORDER — FENTANYL CITRATE (PF) 100 MCG/2ML IJ SOLN
INTRAMUSCULAR | Status: AC
  Administered 2021-04-25: 25 ug via INTRAVENOUS
  Filled 2021-04-25: qty 2

## 2021-04-25 MED ORDER — ACETAMINOPHEN 10 MG/ML IV SOLN
INTRAVENOUS | Status: AC
  Filled 2021-04-25: qty 100

## 2021-04-25 MED ORDER — ONDANSETRON HCL 4 MG/2ML IJ SOLN
INTRAMUSCULAR | Status: AC
  Filled 2021-04-25: qty 2

## 2021-04-25 MED ORDER — FENTANYL CITRATE (PF) 100 MCG/2ML IJ SOLN
25.0000 ug | INTRAMUSCULAR | Status: DC | PRN
  Administered 2021-04-25 (×3): 25 ug via INTRAVENOUS

## 2021-04-25 MED ORDER — SODIUM CHLORIDE 0.9 % IV SOLN: INTRAVENOUS | Status: DC

## 2021-04-25 MED ORDER — OXYCODONE HCL 5 MG PO TABS
5.0000 mg | ORAL_TABLET | Freq: Once | ORAL | Status: AC | PRN
  Administered 2021-04-25: 5 mg via ORAL

## 2021-04-25 MED ORDER — ONDANSETRON HCL 4 MG/2ML IJ SOLN
INTRAMUSCULAR | Status: DC | PRN
  Administered 2021-04-25: 4 mg via INTRAVENOUS

## 2021-04-25 MED ORDER — MIDAZOLAM HCL 2 MG/2ML IJ SOLN
INTRAMUSCULAR | Status: AC
  Filled 2021-04-25: qty 2

## 2021-04-25 MED ORDER — FENTANYL CITRATE (PF) 100 MCG/2ML IJ SOLN
INTRAMUSCULAR | Status: AC
  Filled 2021-04-25: qty 2

## 2021-04-25 MED ORDER — BUPIVACAINE-EPINEPHRINE (PF) 0.5% -1:200000 IJ SOLN
INTRAMUSCULAR | Status: DC | PRN
  Administered 2021-04-25: 30 mL

## 2021-04-25 MED ORDER — PROMETHAZINE HCL 25 MG/ML IJ SOLN: 6.2500 mg | INTRAMUSCULAR | Status: DC | PRN

## 2021-04-25 MED ORDER — CEFAZOLIN SODIUM-DEXTROSE 2-4 GM/100ML-% IV SOLN
INTRAVENOUS | Status: AC
  Filled 2021-04-25: qty 100

## 2021-04-25 MED ORDER — ACETAMINOPHEN 10 MG/ML IV SOLN: 1000.0000 mg | Freq: Once | INTRAVENOUS | Status: DC | PRN

## 2021-04-25 SURGICAL SUPPLY — 30 items
APL PRP STRL LF DISP 70% ISPRP (MISCELLANEOUS) ×1
BLADE INCISOR PLUS 4.5 (BLADE) ×2 IMPLANT
BNDG ELASTIC 4X5.8 VLCR STR LF (GAUZE/BANDAGES/DRESSINGS) ×2 IMPLANT
CHLORAPREP W/TINT 26 (MISCELLANEOUS) ×2 IMPLANT
DRAPE ARTHRO LIMB 89X125 STRL (DRAPES) ×2 IMPLANT
DRAPE C-ARM XRAY 36X54 (DRAPES) ×2 IMPLANT
DRAPE C-ARMOR (DRAPES) ×2 IMPLANT
DRAPE IMP U-DRAPE 54X76 (DRAPES) ×1 IMPLANT
GAUZE SPONGE 4X4 12PLY STRL (GAUZE/BANDAGES/DRESSINGS) ×2 IMPLANT
GLOVE SURG SYN 9.0  PF PI (GLOVE) ×1
GLOVE SURG SYN 9.0 PF PI (GLOVE) ×1 IMPLANT
GOWN SRG 2XL LVL 4 RGLN SLV (GOWNS) ×1 IMPLANT
GOWN STRL NON-REIN 2XL LVL4 (GOWNS) ×2
GOWN STRL REUS W/ TWL LRG LVL3 (GOWN DISPOSABLE) ×2 IMPLANT
GOWN STRL REUS W/TWL LRG LVL3 (GOWN DISPOSABLE) ×4
GRAFT FILLER BONE 5ML (Knees) IMPLANT
KIT ACCUFILL 5CC (Knees) ×1 IMPLANT
KIT KNEE SCP 414.502 (Knees) ×2 IMPLANT
KIT TURNOVER KIT A (KITS) ×2 IMPLANT
MANIFOLD NEPTUNE II (INSTRUMENTS) ×3 IMPLANT
NEEDLE HYPO 22GX1.5 SAFETY (NEEDLE) ×2 IMPLANT
PACK ARTHROSCOPY KNEE (MISCELLANEOUS) ×2 IMPLANT
SCALPEL PROTECTED #11 DISP (BLADE) ×2 IMPLANT
SPONGE T-LAP 18X18 ~~LOC~~+RFID (SPONGE) ×2 IMPLANT
SUT ETHILON 4-0 (SUTURE) ×2
SUT ETHILON 4-0 FS2 18XMFL BLK (SUTURE) ×1
SUTURE ETHLN 4-0 FS2 18XMF BLK (SUTURE) ×1 IMPLANT
TUBING INFLOW SET DBFLO PUMP (TUBING) ×2 IMPLANT
TUBING OUTFLOW SET DBLFO PUMP (TUBING) ×2 IMPLANT
WAND COBLATION FLOW 50 (SURGICAL WAND) ×2 IMPLANT

## 2021-04-25 NOTE — H&P (Signed)
Chief Complaint  Patient presents with   Knee Pain  RECHECK LEFT KNEE    History of the Present Illness: Ann Crawford is a 76 y.o. female here today for evaluation of left knee osteoarthritis of the medial compartment that is significant on x-rays in the office. She had an MRI that shows significant bone contusion as well as a medial meniscus tear. She comes in to review that to determine whether arthroscopy may be of benefit or total knee arthroplasty. She is accompanied by an adult female.  The patient locates her pain to the medial aspect of her left knee. She states she has pain when she tries to lay down at night. She states the pain started about 8 weeks ago. She denies any injury. The patient states she has occasional shifting around the medial aspect of her left knee, but not regularly. She states she is not much active anymore due to her left knee pain. She states she usually gets about 3000 steps a day, and her goal is 5000 steps, but that does not happen very often. The patient states she takes 6 to 8 ibuprofen plus Tylenol in between. She states she has tried TRAMADOL, but it looked like she had a sunburn.  The patient has a history of thyroid issues and acid reflux. She takes pantoprazole. She has a history of right knee arthroscopy. She has had a right knee injection by Cranston Neighbor, PA, which helped, but the injection she received in her left knee did not help at all. She states 1 Benadryl helps her sleep well, but thinks half pill should be enough to take care of her pain.  She lives in [place].  I have reviewed past medical, surgical, social and family history, and allergies as documented in the EMR.  Past Medical History: Past Medical History:  Diagnosis Date   Allergy April 2017  Cough syrup with codene   History of hemorrhoids   Hypothyroidism   OA (osteoarthritis)   Pure hypercholesterolemia 04/03/2019   Past Surgical History: Past Surgical History:  Procedure  Laterality Date   LAPAROSCOPIC TUBAL LIGATION Bilateral 1980   Arthroscopic right shoulder surgery Right 1994  Torn rotator cuff and bone spur removal   Right Knee Arthroscopy Right September 2007   COLONOSCOPY 01/01/2014  No Polyps - repeat 10 years per Dr. Shelle Iron   EGD 06/03/2018  Gastritis: No repeat per RTE   COLONOSCOPY 06/03/2018  Colonic Mucosa: CBF 05/2028   KNEE ARTHROSCOPY   Past Family History: Family History  Problem Relation Age of Onset   High blood pressure (Hypertension) Mother   Irritable bowel syndrome Mother   Coronary Artery Disease (Blocked arteries around heart) Maternal Grandfather   Coronary Artery Disease (Blocked arteries around heart) Paternal Grandmother   Prostate cancer Father   Colon cancer Maternal Grandmother 86   Irritable bowel syndrome Maternal Grandmother   Irritable bowel syndrome Maternal Aunt   Medications: Current Outpatient Medications Ordered in Epic  Medication Sig Dispense Refill   ascorbic acid (VITA-C ORAL) Take by mouth Takes once or twice a week   ascorbic acid/elderberry fruit (AIRBORNE, ELDERBERRY, ORAL) Take by mouth   azelastine (ASTELIN) 137 mcg nasal spray Place 1 spray into both nostrils 2 (two) times daily 30 mL 11   cholecalciferol (VITAMIN D3) 1,000 unit capsule Take 1,000 Units by mouth Taking 10,000 units THREE TIMES A WEEK   CHROMIUM ORAL Take 1 tablet by mouth once daily   Compound Medication miyarisan   cyanocobalamin (VITAMIN B12)  1000 MCG tablet Take 1,000 mcg by mouth Takes 1000 mg three times a week   dextromethorphan polistirex (DELSYM) 30 mg/5 mL liquid Take by mouth 2 (two) times daily as needed for Cough   fluticasone propionate (FLONASE) 50 mcg/actuation nasal spray Place 2 sprays into both nostrils once daily 16 g 11   levothyroxine (SYNTHROID) 75 MCG tablet TAKE 1 TABLET EVERY DAY ON EMPTY STOMACHWITH A GLASS OF WATER AT LEAST 30-60 MINBEFORE BREAKFAST 90 tablet 1   magnesium oxide (MAG-OX) 400 mg (241.3 mg  magnesium) tablet Take 400 mg by mouth once daily   pantoprazole (PROTONIX) 20 MG DR tablet Take 1 tablet (20 mg total) by mouth 2 (two) times daily before meals 180 tablet 1   pseudoephedrine (SUDAFED) 30 mg tablet Take 30 mg by mouth every 4 (four) hours as needed for Congestion   selenium 200 mcg Cap Take 1 capsule by mouth once daily.   vitamin E 400 UNIT capsule Take 400 Units by mouth as directed Twice a week   zinc 50 mg Tab Take by mouth Take 1 tablet by mouth 4 times a week   levothyroxine (SYNTHROID) 50 MCG tablet TAKE 1 TABLET EVERY MORNING WITH A GLASSOF WATER 30 TO 60 MINUTES BEFORE BREAKFAST (Patient not taking: Reported on 04/12/2021) 90 tablet 1   MELATONIN ORAL Take by mouth (Patient not taking: Reported on 04/12/2021)   No current Epic-ordered facility-administered medications on file.   Allergies: Allergies  Allergen Reactions   Dicyclomine Diarrhea and Dizziness  Took one pill and started feeling dizzy and had diarrhea last that day.   Latex, Natural Rubber Rash   Azithromycin Other (See Comments)  Lost sense of taste for a long period of time. Avoids if possible.   Omeprazole Itching    Body mass index is 28.75 kg/m.  Review of Systems: A comprehensive 14 point ROS was performed, reviewed, and the pertinent orthopaedic findings are documented in the HPI.  Vitals:  04/12/21 1253  BP: (!) 160/90    General Physical Examination:   General/Constitutional: No apparent distress: well-nourished and well developed. Eyes: Pupils equal, round with synchronous movement. Lungs: Clear to auscultation HEENT: Normal Vascular: No edema, swelling or tenderness, except as noted in detailed exam. Cardiac: Heart rate and rhythm is regular. Integumentary: No impressive skin lesions present, except as noted in detailed exam. Neuro/Psych: Normal mood and affect, oriented to person, place and time.  Musculoskeletal Examination:  On exam, left knee range of motion was 0-115  degrees. Positive medial McMurray. Negative lateral McMurray. No instability to exam. No crepitation to exam. Medial joint line and lateral joint line tenderness. Left knee effusion.  Radiographs:  No new imaging studies were obtained today.  Assessment: ICD-10-CM  1. Primary osteoarthritis of left knee M17.12  2. Stress fracture of right femur with delayed healing, subsequent encounter M84.351G  3. Complex tear of medial meniscus of left knee as current injury, subsequent encounter S83.232D  4. Complex tear of lateral meniscus of left knee as current injury, subsequent encounter S83.272D   The patient has clinical findings of left knee osteoarthritis of the medial compartment, as well as a medial meniscus tear.  Plan:  We discussed the patient's prior MRI and x-ray findings. I explained she has a meniscus tear, bone bruise, and osteoarthritis. I explained she has quite a bit of joint space in her left knee. I recommend left knee arthroscopy, meniscal debridement with subchondroplasty. I explained the surgery and postoperative course in detail.  We  will schedule the patient for left knee arthroscopy, meniscal debridement with subchondroplasty in the next 2 weeks.  Surgical Risks:  The nature of the condition and the proposed procedure has been reviewed in detail with the patient. Surgical versus non-surgical options and prognosis for recovery have been reviewed and the inherent risks and benefits of each have been discussed including the risks of infection, bleeding, injury to nerves/blood vessels/tendons, incomplete relief of symptoms, persisting pain and/or stiffness, loss of function, complex regional pain syndrome, failure of the procedure, as appropriate.  Document Attestation: I, Sowjanya Panditi, am documenting for [facility] JOSE[facility] Adonias Demore, MD, utilizing [employer] DAX.   Electronically signed by Marlena ClipperMenz, Emika Tiano Joseph, MD at 04/14/2021 8:30 AM EST  Reviewed  H+P. No changes noted.

## 2021-04-25 NOTE — Anesthesia Preprocedure Evaluation (Addendum)
Anesthesia Evaluation  Patient identified by MRN, date of birth, ID band Patient awake    Reviewed: Allergy & Precautions, NPO status , Patient's Chart, lab work & pertinent test results, reviewed documented beta blocker date and time   Airway Mallampati: III  TM Distance: >3 FB Neck ROM: Full    Dental no notable dental hx.    Pulmonary neg pulmonary ROS,    Pulmonary exam normal breath sounds clear to auscultation       Cardiovascular hypertension, Pt. on home beta blockers + Peripheral Vascular Disease  Normal cardiovascular exam Rhythm:Regular Rate:Normal     Neuro/Psych negative neurological ROS  negative psych ROS   GI/Hepatic Neg liver ROS, GERD  Medicated and Controlled,  Endo/Other  Hypothyroidism   Renal/GU negative Renal ROS  negative genitourinary   Musculoskeletal  (+) Arthritis ,   Abdominal Normal abdominal exam  (+)   Peds negative pediatric ROS (+)  Hematology negative hematology ROS (+)   Anesthesia Other Findings   Reproductive/Obstetrics negative OB ROS                            Anesthesia Physical Anesthesia Plan  ASA: 2  Anesthesia Plan: General   Post-op Pain Management:    Induction: Intravenous  PONV Risk Score and Plan: Ondansetron, Dexamethasone and Treatment may vary due to age or medical condition  Airway Management Planned: LMA  Additional Equipment:   Intra-op Plan:   Post-operative Plan: Extubation in OR  Informed Consent: I have reviewed the patients History and Physical, chart, labs and discussed the procedure including the risks, benefits and alternatives for the proposed anesthesia with the patient or authorized representative who has indicated his/her understanding and acceptance.     Dental advisory given  Plan Discussed with: CRNA and Anesthesiologist  Anesthesia Plan Comments:         Anesthesia Quick Evaluation

## 2021-04-25 NOTE — Anesthesia Procedure Notes (Addendum)
Procedure Name: LMA Insertion Date/Time: 04/25/2021 12:41 PM Performed by: Iran Planas, CRNA Pre-anesthesia Checklist: Patient identified, Patient being monitored, Timeout performed, Emergency Drugs available and Suction available Patient Re-evaluated:Patient Re-evaluated prior to induction Oxygen Delivery Method: Circle system utilized Preoxygenation: Pre-oxygenation with 100% oxygen Induction Type: IV induction Ventilation: Mask ventilation without difficulty LMA: LMA inserted LMA Size: 3.0 Tube type: Oral Number of attempts: 1 Placement Confirmation: positive ETCO2 and breath sounds checked- equal and bilateral Tube secured with: Tape Dental Injury: Teeth and Oropharynx as per pre-operative assessment

## 2021-04-25 NOTE — Discharge Instructions (Addendum)
Take it easy until Friday just walking around the house.   Friday remove entire bandage and cover 3 incisions with Band-Aids Then okay to advance activity as tolerated.   Pain medicine as directed Call office for any problems AMBULATORY SURGERY  DISCHARGE INSTRUCTIONS   The drugs that you were given will stay in your system until tomorrow so for the next 24 hours you should not:  Drive an automobile Make any legal decisions Drink any alcoholic beverage   You may resume regular meals tomorrow.  Today it is better to start with liquids and gradually work up to solid foods.  You may eat anything you prefer, but it is better to start with liquids, then soup and crackers, and gradually work up to solid foods.   Please notify your doctor immediately if you have any unusual bleeding, trouble breathing, redness and pain at the surgery site, drainage, fever, or pain not relieved by medication.    Additional Instructions:        Please contact your physician with any problems or Same Day Surgery at [phone], Monday through Friday 6 am to 4 pm, or [place] at [facility] number at [phone].

## 2021-04-25 NOTE — Transfer of Care (Signed)
Immediate Anesthesia Transfer of Care Note  Patient: Ann Crawford  Procedure(s) Performed: Left knee medial femoral subchondroplasty (Left: Knee) Left partial medial meniscectomies (Left: Knee)  Patient Location: PACU  Anesthesia Type:General  Level of Consciousness: drowsy  Airway & Oxygen Therapy: Patient Spontanous Breathing  Post-op Assessment: Report given to RN  Post vital signs: Reviewed and stable   Last Vitals:  Vitals Value Taken Time  BP 145/73 04/25/21 1340  Temp 36.9 C 04/25/21 1340  Pulse 69 04/25/21 1345  Resp 16 04/25/21 1345  SpO2 100 % 04/25/21 1345  Vitals shown include unvalidated device data.  Last Pain:  Vitals:   04/25/21 1039  TempSrc: Temporal  PainSc: 0-No pain         Complications: No notable events documented.

## 2021-04-25 NOTE — Op Note (Signed)
04/25/2021  1:41 PM  PATIENT:  Ann Crawford  76 y.o. female  PRE-OPERATIVE DIAGNOSIS:  Primary osteoarthritis of left knee  M17.12 Stress fracture of right femur with delayed healing, subsequent encounter  M84.351G Complex tear of medial meniscus of left knee as current injury, subsequent encounter S83.232D Complex tear of lateral meniscus of left knee as current injury, subsequent encounter  S83.272D  POST-OPERATIVE DIAGNOSIS:  Primary osteoarthritis of left knee  M17.12 Stress fracture of right femur with delayed healing, subsequent encounter  M84.351G Complex tear of medial meniscus of left knee as current injury, subsequent encounter S83.232D  PROCEDURE:  Procedure(s): Left knee medial femoral subchondroplasty (Left) Left partial medial meniscectomies (Left)  SURGEON: Laurene Footman, MD  ASSISTANTS: None  ANESTHESIA:   general  EBL:  Total I/O In: 500 [I.V.:500] Out: -   BLOOD ADMINISTERED:none  DRAINS: none   LOCAL MEDICATIONS USED:  MARCAINE     SPECIMEN:  No Specimen  DISPOSITION OF SPECIMEN:  N/A  COUNTS:  YES  TOURNIQUET:  * Missing tourniquet times found for documented tourniquets in log: [id number] *  IMPLANTS: Bone substitute from [employer] subchondral Plasty kit  DICTATION: .Dragon Dictation patient brought the operating room and after adequate anesthesia was obtained the left leg was prepped and draped you sterile fashion with a tourniquet applied but not inflated.  After patient identification and timeout procedures were completed an inferior lateral portal was made and the arthroscope introduced.  X-ray revealed moderate patellofemoral degenerative changes with normal tracking no loose bodies in the gutters.  Coming on medially an inferomedial portal was made and a probe introduced showing small tear of the middle third of the medial meniscus and some anterior meniscal tear this was subsequently ablated with ArthroCare wand articular cartilage showed  partial-thickness loss without any fissuring and no exposed bone ACL is intact.  Going to the lateral compartment the lateral compartment was normal with just some mild degenerative change but intact meniscus despite despite MRI showing tear none was noted.  The arthroscope was withdrawn at this time C arm brought in and plan made for approach based on the C arm views and MRI findings a small incision was made medially over the medial femoral condyle and the drill was placed with the bone substitute mixed and injected into the bone in the medial femoral condyle with good fill without and no extravasation.  After allowing it to set for 10 minutes the drill was withdrawn permanent C arm views obtained the portals and an area of the medial subchondral plasty were injected with a total of 30 cc half percent Sensorcaine with epinephrine the the arthroscope was reintroduced and there was no extravasation of the bone substitute into the knee knee was then flushed instrumentation withdrawn wounds closed with simple erupted 4-0 nylon followed by Xeroform 4 x 4 web roll and Ace wrap  PLAN OF CARE: Discharge to home after PACU  PATIENT DISPOSITION:  PACU - hemodynamically stable.

## 2021-04-26 ENCOUNTER — Encounter: Payer: Self-pay | Admitting: Orthopedic Surgery

## 2021-04-27 NOTE — Anesthesia Postprocedure Evaluation (Signed)
Anesthesia Post Note  Patient: Ann Crawford  Procedure(s) Performed: Left knee medial femoral subchondroplasty (Left: Knee) Left partial medial meniscectomies (Left: Knee)  Patient location during evaluation: PACU Anesthesia Type: General Level of consciousness: awake and alert Pain management: pain level controlled Vital Signs Assessment: post-procedure vital signs reviewed and stable Respiratory status: spontaneous breathing, nonlabored ventilation and respiratory function stable Cardiovascular status: blood pressure returned to baseline and stable Postop Assessment: no apparent nausea or vomiting Anesthetic complications: no   No notable events documented.   Last Vitals:  Vitals:   04/25/21 1459 04/25/21 1619  BP: 134/62 (!) 137/54  Pulse: (!) 58 76  Resp: 16 16  Temp: (!) 36.1 C   SpO2: 97% 98%    Last Pain:  Vitals:   04/26/21 0900  TempSrc:   PainSc: [place]

## 2021-05-20 ENCOUNTER — Other Ambulatory Visit: Payer: Self-pay

## 2021-05-20 ENCOUNTER — Ambulatory Visit
Admission: RE | Admit: 2021-05-20 | Discharge: 2021-05-20 | Disposition: A | Discharge status: Home / Self Care | Payer: Medicare Other | Source: Ambulatory Visit | Attending: Medical Oncology | Admitting: Medical Oncology

## 2021-05-20 VITALS — BP 175/95 | HR 90 | Temp 98.0°F | Resp 14 | Ht 62.0 in | Wt 155.0 lb

## 2021-05-20 DIAGNOSIS — R0982 Postnasal drip: Secondary | ICD-10-CM

## 2021-05-20 DIAGNOSIS — J029 Acute pharyngitis, unspecified: Secondary | ICD-10-CM | POA: Diagnosis not present

## 2021-05-20 LAB — GROUP A STREP BY PCR: Group A Strep by PCR: NOT DETECTED

## 2021-05-20 MED ORDER — LIDOCAINE VISCOUS HCL 2 % MT SOLN: 10.0000 mL | Freq: Three times a day (TID) | OROMUCOSAL | 0 refills | Status: AC | PRN

## 2021-05-20 MED ORDER — CETIRIZINE HCL 10 MG PO TABS
10.0000 mg | ORAL_TABLET | Freq: Every day | ORAL | 0 refills | Status: AC
Start: 2021-05-20 — End: ?

## 2021-05-20 NOTE — ED Provider Notes (Signed)
[facility]    CSN: [id number] Arrival date & time: 05/20/21  1243      History   Chief Complaint Chief Complaint  Patient presents with   Appointment   Sore Throat    HPI Ann Crawford is a 76 y.o. female.   HPI  Sore Throat: Patient states that she has had a sore throat for the past 3 days.  She reports that earlier in January she had similar symptoms and was started on cetirizine and antibiotics as she had upcoming knee surgery.  Then after surgery she had suspected strep pharyngitis and was treated with Augmentin.  She states that this fully resolved her symptoms until they returned 3 days ago.  She thinks that she is having most of her symptoms due to sinus drainage.  She denies any fevers, cough, shortness of breath.  She has tried Astelin, Flonase and zyrtec for symptoms without much relief.   Past Medical History:  Diagnosis Date   GERD (gastroesophageal reflux disease)    Hypertension    Peripheral arterial disease (HCC)    Thyroid disease     Patient Active Problem List   Diagnosis Date Noted   Peripheral arterial disease (HCC) 01/17/2018   Varicose veins of leg with pain, bilateral 01/17/2018   OA (osteoarthritis) 09/08/2013    Past Surgical History:  Procedure Laterality Date   KNEE ARTHROSCOPY Right    KNEE ARTHROSCOPY WITH MEDIAL MENISECTOMY Left 04/25/2021   Procedure: Left partial medial meniscectomies;  Surgeon: Kennedy Bucker, MD;  Location: [facility] ORS;  Service: Orthopedics;  Laterality: Left;   KNEE ARTHROSCOPY WITH SUBCHONDROPLASTY Left 04/25/2021   Procedure: Left knee medial femoral subchondroplasty;  Surgeon: Kennedy Bucker, MD;  Location: [facility] ORS;  Service: Orthopedics;  Laterality: Left;   ROTATOR CUFF REPAIR     unsure site   TUBAL LIGATION      OB History   No obstetric history on file.      Home Medications    Prior to Admission medications   Medication Sig Start Date End Date Taking? Authorizing Provider  Ascorbic Acid  (VITAMIN C) 1000 MG tablet Take 1,000 mg by mouth 3 (three) times a week.   Yes [provider]  cetirizine (ZYRTEC) 10 MG tablet Take 10 mg by mouth daily.   Yes [provider]  Cyanocobalamin (B-12) 5000 MCG CAPS Take 5,000 mcg by mouth daily.   Yes [provider]  levothyroxine (SYNTHROID) 75 MCG tablet Take 75 mcg by mouth daily before breakfast.   Yes [provider]  Menaquinone-7 (VITAMIN K2) 100 MCG CAPS Take 100 mcg by mouth daily.   Yes [provider]  niacinamide 500 MG tablet Take 500 mg by mouth daily.   Yes [provider]  pantoprazole (PROTONIX) 20 MG tablet Take 20 mg by mouth 2 (two) times daily before a meal.   Yes [provider]  Probiotic Product (PROBIOTIC PO) Take 3 capsules by mouth daily as needed (upset gi). [place]   Yes [provider]  acetaminophen (TYLENOL) 325 MG tablet Take 650 mg by mouth 2 (two) times daily as needed for moderate pain.    [provider]  azelastine (ASTELIN) 0.1 % nasal spray Place 1 spray into both nostrils 2 (two) times daily as needed for rhinitis. Use in each nostril as directed    [provider]  Cholecalciferol (VITAMIN D3) 250 MCG (10000 UT) capsule Take 10,000 Units by mouth 3 (three) times a week.  [provider]  Chromium 1000 MCG TABS Take 1,000 mcg by mouth daily.    [provider]  diphenhydrAMINE (BENADRYL) 25 MG tablet Take 12.5-25 mg by mouth daily as needed for allergies.    [provider]  ELDERBERRY PO Take 2-4 capsules by mouth daily as needed (immune support). gummies    [provider]  fluticasone (FLONASE) 50 MCG/ACT nasal spray Place 2 sprays into both nostrils daily as needed for allergies.    [provider]  HYDROcodone-acetaminophen (NORCO) 5-325 MG tablet Take 1 tablet by mouth every 6 (six) hours as needed for moderate pain. 04/25/21   Kennedy Bucker, MD  ibuprofen (ADVIL)  200 MG tablet Take 400 mg by mouth every 6 (six) hours as needed for moderate pain.    [provider]  Magnesium Oxide 250 MG TABS Take 250 mg by mouth 2 (two) times daily.    [provider]  metoprolol succinate (TOPROL-XL) 25 MG 24 hr tablet Take 25 mg by mouth daily.    [provider]  OVER THE COUNTER MEDICATION Take 2 Scoops by mouth daily. Collagen peptide powder    [provider]  Selenium 200 MCG CAPS Take 200 mcg by mouth daily.    [provider]  vitamin E 1000 UNIT capsule Take 1,000 Units by mouth 2 (two) times a week.    [provider]  Zinc 50 MG CAPS Take 50 mg by mouth 4 (four) times a week.    [provider]    Family History Family History  Problem Relation Age of Onset   Hypertension Mother    Varicose Veins Paternal Aunt    Breast cancer Neg Hx     Social History Social History   Tobacco Use   Smoking status: Never   Smokeless tobacco: Never  Vaping Use   Vaping Use: Never used  Substance Use Topics   Alcohol use: Yes   Drug use: Never     Allergies   Dicyclomine, Codeine, Hydromet [hydrocodone bit-homatrop mbr], Metoprolol, Other, Azithromycin, Latex, Omeprazole, and Tramadol hcl   Review of Systems Review of Systems  As stated above in HPI Physical Exam Triage Vital Signs ED Triage Vitals  Enc Vitals Group     BP 05/20/21 1338 (!) 175/95     Pulse Rate 05/20/21 1338 90     Resp 05/20/21 1338 14     Temp 05/20/21 1338 98 F (36.7 C)     Temp Source 05/20/21 1338 Oral     SpO2 05/20/21 1338 99 %     Weight 05/20/21 1335 155 lb (70.3 kg)     Height 05/20/21 1335 5\' 2"  (1.575 m)     Head Circumference --      Peak Flow --      Pain Score 05/20/21 1335 2     Pain Loc --      Pain Edu? --      Excl. in GC? --    No data found.  Updated Vital Signs BP (!) 175/95 (BP Location: Left Arm)    Pulse 90    Temp 98 F (36.7 C) (Oral)    Resp 14    Ht 5\' 2"  (1.575 m)    Wt 155  lb (70.3 kg)    SpO2 99%    BMI 28.35 kg/m   Physical Exam Vitals and nursing note reviewed.  Constitutional:      General: She is not in acute distress.  Appearance: She is well-developed. She is not ill-appearing, toxic-appearing or diaphoretic.  HENT:     Head: Normocephalic and atraumatic.     Right Ear: Tympanic membrane normal. No middle ear effusion. Tympanic membrane is not erythematous.     Left Ear: Tympanic membrane normal.  No middle ear effusion. Tympanic membrane is not erythematous.     Nose: Rhinorrhea present. No congestion.     Mouth/Throat:     Mouth: Mucous membranes are moist.     Pharynx: Oropharynx is clear.     Tonsils: No tonsillar exudate.     Comments: Post nasal drainage. One or two small white stuck on lesions of throat Eyes:     Conjunctiva/sclera: Conjunctivae normal.     Pupils: Pupils are equal, round, and reactive to light.  Cardiovascular:     Rate and Rhythm: Normal rate and regular rhythm.     Heart sounds: Normal heart sounds.  Pulmonary:     Effort: Pulmonary effort is normal.     Breath sounds: Normal breath sounds.  Musculoskeletal:     Cervical back: Normal range of motion and neck supple.  Lymphadenopathy:     Cervical: No cervical adenopathy.  Skin:    General: Skin is warm.  Neurological:     General: No focal deficit present.     Mental Status: She is alert.     UC Treatments / Results  Labs (all labs ordered are listed, but only abnormal results are displayed) Labs Reviewed  GROUP A STREP BY PCR    EKG   Radiology No results found.  Procedures Procedures (including critical care time)  Medications Ordered in UC Medications - No data to display  Initial Impression / Assessment and Plan / UC Course  I have reviewed the triage vital signs and the nursing notes.  Pertinent labs & imaging results that were available during my care of the patient were reviewed by me and considered in my medical decision making  (see chart for details).     New.  Appears to be secondary to postnasal drainage and what also appears to be potential oral thrush especially given her multiple antibiotic uses.  We discussed that her strep testing in office was negative today so she does not need any additional antibiotics.  For now treating with her continued medication along with Dukes Magic mouthwash.  Should symptoms continue I would recommend ENT follow-up. Final Clinical Impressions(s) / UC Diagnoses   Final diagnoses:  None   Discharge Instructions   None    ED Prescriptions   None    PDMP not reviewed this encounter.   Rushie ChestnutCovington, Summerlyn Fickel M, [place] JerseyPA-C 05/20/21 1450

## 2021-05-20 NOTE — ED Triage Notes (Signed)
Patient c/o sore throat and swollen tonsil on the left side that started 3 days ago.  Patient denies fevers.

## 2021-06-07 ENCOUNTER — Other Ambulatory Visit: Payer: Self-pay | Admitting: Orthopedic Surgery

## 2021-06-07 DIAGNOSIS — M1711 Unilateral primary osteoarthritis, right knee: Secondary | ICD-10-CM

## 2021-06-07 DIAGNOSIS — M2391 Unspecified internal derangement of right knee: Secondary | ICD-10-CM

## 2021-06-19 ENCOUNTER — Other Ambulatory Visit: Payer: Self-pay

## 2021-06-19 ENCOUNTER — Ambulatory Visit
Admission: RE | Admit: 2021-06-19 | Discharge: 2021-06-19 | Disposition: A | Discharge status: Home / Self Care | Payer: Medicare Other | Source: Ambulatory Visit | Attending: Orthopedic Surgery | Admitting: Orthopedic Surgery

## 2021-06-19 DIAGNOSIS — M1711 Unilateral primary osteoarthritis, right knee: Secondary | ICD-10-CM | POA: Diagnosis present

## 2021-06-19 DIAGNOSIS — M2391 Unspecified internal derangement of right knee: Secondary | ICD-10-CM | POA: Diagnosis not present

## 2021-08-18 ENCOUNTER — Other Ambulatory Visit: Payer: Self-pay | Admitting: Internal Medicine

## 2021-08-18 DIAGNOSIS — Z1231 Encounter for screening mammogram for malignant neoplasm of breast: Secondary | ICD-10-CM

## 2021-09-14 ENCOUNTER — Ambulatory Visit
Admission: RE | Admit: 2021-09-14 | Discharge: 2021-09-14 | Disposition: A | Discharge status: Home / Self Care | Payer: Medicare Other | Source: Ambulatory Visit | Attending: Internal Medicine | Admitting: Internal Medicine

## 2021-09-14 DIAGNOSIS — Z1231 Encounter for screening mammogram for malignant neoplasm of breast: Secondary | ICD-10-CM | POA: Diagnosis present

## 2022-09-05 ENCOUNTER — Other Ambulatory Visit: Payer: Self-pay | Admitting: Internal Medicine

## 2022-09-05 DIAGNOSIS — Z1231 Encounter for screening mammogram for malignant neoplasm of breast: Secondary | ICD-10-CM

## 2022-09-21 ENCOUNTER — Ambulatory Visit
Admission: RE | Admit: 2022-09-21 | Discharge: 2022-09-21 | Disposition: A | Discharge status: Home / Self Care | Payer: Medicare Other | Source: Ambulatory Visit | Attending: Internal Medicine | Admitting: Internal Medicine

## 2022-09-21 DIAGNOSIS — Z1231 Encounter for screening mammogram for malignant neoplasm of breast: Secondary | ICD-10-CM | POA: Diagnosis present

## 2022-09-25 ENCOUNTER — Other Ambulatory Visit: Payer: Self-pay | Admitting: Internal Medicine

## 2022-09-25 DIAGNOSIS — R928 Other abnormal and inconclusive findings on diagnostic imaging of breast: Secondary | ICD-10-CM

## 2022-10-02 ENCOUNTER — Other Ambulatory Visit: Payer: Medicare Other

## 2022-10-02 ENCOUNTER — Ambulatory Visit
Admission: RE | Admit: 2022-10-02 | Discharge: 2022-10-02 | Disposition: A | Discharge status: Home / Self Care | Payer: Medicare Other | Source: Ambulatory Visit | Attending: Internal Medicine | Admitting: Internal Medicine

## 2022-10-02 DIAGNOSIS — R928 Other abnormal and inconclusive findings on diagnostic imaging of breast: Secondary | ICD-10-CM | POA: Insufficient documentation

## 2023-09-10 ENCOUNTER — Other Ambulatory Visit: Payer: Self-pay | Admitting: Internal Medicine

## 2023-09-10 DIAGNOSIS — Z1231 Encounter for screening mammogram for malignant neoplasm of breast: Secondary | ICD-10-CM

## 2023-09-24 ENCOUNTER — Other Ambulatory Visit: Payer: Self-pay | Admitting: Otolaryngology

## 2023-09-24 DIAGNOSIS — R09A2 Foreign body sensation, throat: Secondary | ICD-10-CM

## 2023-10-03 ENCOUNTER — Ambulatory Visit
Admission: RE | Admit: 2023-10-03 | Discharge: 2023-10-03 | Disposition: A | Discharge status: Home / Self Care | Source: Ambulatory Visit | Attending: Otolaryngology | Admitting: Otolaryngology

## 2023-10-03 DIAGNOSIS — R09A2 Foreign body sensation, throat: Secondary | ICD-10-CM

## 2023-10-07 ENCOUNTER — Ambulatory Visit
Admission: RE | Admit: 2023-10-07 | Discharge: 2023-10-07 | Disposition: A | Discharge status: Home / Self Care | Source: Ambulatory Visit | Attending: Internal Medicine | Admitting: Internal Medicine

## 2023-10-07 DIAGNOSIS — Z1231 Encounter for screening mammogram for malignant neoplasm of breast: Secondary | ICD-10-CM | POA: Diagnosis present

## 2023-10-09 ENCOUNTER — Other Ambulatory Visit

## 2023-10-25 ENCOUNTER — Other Ambulatory Visit: Payer: Self-pay | Admitting: Otolaryngology

## 2023-10-25 DIAGNOSIS — R051 Acute cough: Secondary | ICD-10-CM

## 2023-10-25 DIAGNOSIS — R09A2 Foreign body sensation, throat: Secondary | ICD-10-CM

## 2023-10-25 DIAGNOSIS — R1314 Dysphagia, pharyngoesophageal phase: Secondary | ICD-10-CM

## 2023-10-29 ENCOUNTER — Ambulatory Visit
Admission: RE | Admit: 2023-10-29 | Discharge: 2023-10-29 | Disposition: A | Discharge status: Home / Self Care | Source: Ambulatory Visit | Attending: Otolaryngology | Admitting: Otolaryngology

## 2023-10-29 DIAGNOSIS — R1314 Dysphagia, pharyngoesophageal phase: Secondary | ICD-10-CM | POA: Diagnosis present

## 2023-10-29 DIAGNOSIS — R051 Acute cough: Secondary | ICD-10-CM | POA: Insufficient documentation

## 2023-10-29 DIAGNOSIS — R09A2 Foreign body sensation, throat: Secondary | ICD-10-CM | POA: Insufficient documentation

## 2023-10-29 NOTE — Progress Notes (Addendum)
 Modified Barium Swallow Study  Patient Details  Name: Ann Crawford MRN: [id number] Date of Birth: 09-27-1945  Today's Date: 10/29/2023  Modified Barium Swallow completed.  Full report located under Chart Review in the Imaging Section.  History of Present Illness Pt is a 78 yo female w/ PMH including GERD on PPI 20 mg BID (still breakthrough Reflux episodes per pt), Hypothyroidism, Vitamin D deficiency, OA (osteoarthritis), Peripheral arterial disease, Varicose veins of leg with pain, Pure hypercholesterolemia, HTN, Globus sensation, R hip pain, knee pain.  Thyroid  US  09/2023-  Heterogeneous but otherwise unremarkable thyroid  gland.  No significant thyroid  enlargement or evidence of thyroid  nodules that would warrant further evaluation..   She has followed up w/ ENT for the Globus sensation w/ No anatomical issues noted by ENT via direct view.  No recent chest imaging.  Pt reported no weight loss; she is careful of what she eats (esp. dairy) d/t lower GI bloating/IBS-like issues- she feels that this is an ongoing issue for her.  She reports current Stress.  No dxs of pneumonia and no tx for pulmonary issues per chart.   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 ANY Esophageal phase dysmotility can impact the oropharyngeal phase of swallowing.  No aspiration nor laryngeal penetration noted during this study. No pharyngeal residue buildup.   Oral phase is characterized by adequate lip closure, bolus preparation/mastication and containment, and anterior to posterior transit. Swallow initiation occurs primarily at the posterior angle of the ramus>valleculae w/ all consistencies.  Pharyngeal phase is noted for adequate tongue base retraction and strength/contact to posterior wall, adequate hyolaryngeal excursion, and adequate pharyngeal constriction. Pharyngeal stripping wave is  complete. Epiglottic inversion is complete w/all trials during the study. No aspiration nor laryngeal penetration occurred during all trials this study. No pharyngeal residue remained w/ all trial consistencies. Of Note, any slight amount (inconsistent) of oropharyngeal residue was immediately cleared w/ an independent, f/u/dry swallow.    Amplitude/duration of cricopharyngeus opening appeared [facility]. There was adequate/complete clearance through the immediate, upper cervical Esophagus(viewable to the level of the shoulders) w/ all trial consistencies. However, stasis of barium tablet in the mid-Thoracic Esophagus was noted; min bolus stasis w/ puree/solid trials noted in the area of the lower cervical>upper thoracic Esophagus. An Esophageal sweep was performed in the upright, lateral position noting the Thoracic stasis. A 13 mm barium tablet was given in Puree; it cleared the Esophagus w/ TIME- No oropharyngeal phase dysmotility of the barium tablet.  Pt does have a Baseline of GERD(on 20mg  BID of a PPI) which can contribute to Esophageal phase Dysmotility. The results of the study/video were viewed and discussed w/ pt immediately after, including recommendations for general REFLUX and aspiration precautions and monitoring of any problematic foods; also the recommendation to f/u w/ GI for assessment/management/education of GERD.  Factors that may increase risk of adverse event in presence of aspiration Ann Crawford & Ann Crawford 2021):  (none)   Swallow Evaluation Recommendations Recommendations: PO diet PO Diet Recommendation: Regular; Thin liquids (Level 0)- (small-cut, moisted foods) Liquid Administration via: Cup (less straw use d/t air swallowed) Medication Administration: Whole meds with puree (as needed for ease of clearing the Esophagus) Supervision: Patient able to self-feed Swallowing strategies: Minimize environmental distractions; Slow rate; Small bites/sips; Follow solids with liquids(small, single  sip) Postural changes: Position pt fully upright for meals; Stay upright 30-60 min after meals (GERD precautions) Oral care recommendations: Oral care BID (2x/day);  Pt independent with oral care Recommended consults: Consider GI consultation; Consider esophageal assessment (discussion of PPI dose/management of GERD-- education on Globus sensation superiorly from Esophageal phase Dysmotility distally)        Ann Portugal, MS, CCC-SLP Speech Language Pathologist Rehab Services; [facility] [place] [phone] (ascom) Ann Crawford 10/29/2023,2:55 PM

## 2023-11-27 ENCOUNTER — Encounter: Payer: Self-pay | Admitting: Gastroenterology

## 2023-11-27 ENCOUNTER — Encounter
Admission: RE | Disposition: A | Discharge status: Home / Self Care | Payer: Self-pay | Source: Home / Self Care | Attending: Gastroenterology

## 2023-11-27 ENCOUNTER — Ambulatory Visit
Admission: RE | Admit: 2023-11-27 | Discharge: 2023-11-27 | Disposition: A | Discharge status: Home / Self Care | Attending: Gastroenterology | Admitting: Gastroenterology

## 2023-11-27 ENCOUNTER — Ambulatory Visit: Admitting: Anesthesiology

## 2023-11-27 DIAGNOSIS — R09A2 Foreign body sensation, throat: Secondary | ICD-10-CM | POA: Insufficient documentation

## 2023-11-27 DIAGNOSIS — K2950 Unspecified chronic gastritis without bleeding: Secondary | ICD-10-CM | POA: Insufficient documentation

## 2023-11-27 DIAGNOSIS — I739 Peripheral vascular disease, unspecified: Secondary | ICD-10-CM | POA: Insufficient documentation

## 2023-11-27 DIAGNOSIS — K219 Gastro-esophageal reflux disease without esophagitis: Secondary | ICD-10-CM | POA: Insufficient documentation

## 2023-11-27 DIAGNOSIS — I10 Essential (primary) hypertension: Secondary | ICD-10-CM | POA: Diagnosis not present

## 2023-11-27 DIAGNOSIS — Z79899 Other long term (current) drug therapy: Secondary | ICD-10-CM | POA: Insufficient documentation

## 2023-11-27 HISTORY — PX: ESOPHAGOGASTRODUODENOSCOPY: SHX5428

## 2023-11-27 SURGERY — EGD (ESOPHAGOGASTRODUODENOSCOPY)
Anesthesia: General

## 2023-11-27 MED ORDER — PROPOFOL 10 MG/ML IV BOLUS
INTRAVENOUS | Status: DC | PRN
  Administered 2023-11-27 (×2): 80 mg via INTRAVENOUS

## 2023-11-27 MED ORDER — SODIUM CHLORIDE 0.9 % IV SOLN
INTRAVENOUS | Status: DC
  Administered 2023-11-27 (×2): 500 mL via INTRAVENOUS

## 2023-11-27 NOTE — Transfer of Care (Signed)
 Immediate Anesthesia Transfer of Care Note  Patient: Ann Crawford  Procedure(s) Performed: EGD (ESOPHAGOGASTRODUODENOSCOPY)  Patient Location: PACU  Anesthesia Type:General  Level of Consciousness: awake, alert , and oriented  Airway & Oxygen Therapy: Patient Spontanous Breathing  Post-op Assessment: Report given to RN and Patient moving all extremities  Post vital signs: Reviewed and stable  Last Vitals:  Vitals Value Taken Time  BP    Temp    Pulse    Resp    SpO2      Last Pain:  Vitals:   11/27/23 1024  TempSrc: Temporal  PainSc: 0-No pain         Complications: No notable events documented.

## 2023-11-27 NOTE — Op Note (Signed)
 [facility] Gastroenterology Patient Name: Ann Crawford Procedure Date: 11/27/2023 11:12 AM MRN: [id number] Account #: [id number] Date of Birth: [birth year] Admit Type: Outpatient Age: 78 Room: [facility] ENDO ROOM 3 Gender: Female Note Status: Finalized Instrument Name: Upper GI Scope [id number] Procedure:             Upper GI endoscopy Indications:           Globus sensation Providers:             Corinn Jess Brooklyn MD, MD Referring MD:          Reyes BIRCH. Auston, MD (Referring MD) Medicines:             General Anesthesia Complications:         No immediate complications. Estimated blood loss: None. Procedure:             Pre-Anesthesia Assessment:                        - Prior to the procedure, a History and Physical was                         performed, and patient medications and allergies were                         reviewed. The patient is competent. The risks and                         benefits of the procedure and the sedation options and                         risks were discussed with the patient. All questions                         were answered and informed consent was obtained.                         Patient identification and proposed procedure were                         verified by the physician, the nurse, the                         anesthesiologist, the anesthetist and the technician                         in the pre-procedure area in the procedure room in the                         endoscopy suite. Mental Status Examination: alert and                         oriented. Airway Examination: normal oropharyngeal                         airway and neck mobility. Respiratory Examination:                         clear to auscultation. CV Examination: normal.  Prophylactic Antibiotics: The patient does not require                         prophylactic antibiotics. Prior Anticoagulants: The                         patient has  taken no anticoagulant or antiplatelet                         agents. ASA Grade Assessment: II - A patient with mild                         systemic disease. After reviewing the risks and                         benefits, the patient was deemed in satisfactory                         condition to undergo the procedure. The anesthesia                         plan was to use general anesthesia. Immediately prior                         to administration of medications, the patient was                         re-assessed for adequacy to receive sedatives. The                         heart rate, respiratory rate, oxygen saturations,                         blood pressure, adequacy of pulmonary ventilation, and                         response to care were monitored throughout the                         procedure. The physical status of the patient was                         re-assessed after the procedure.                        After obtaining informed consent, the endoscope was                         passed under direct vision. Throughout the procedure,                         the patient's blood pressure, pulse, and oxygen                         saturations were monitored continuously. The Endoscope                         was introduced through the mouth, and advanced to the  second part of duodenum. The upper GI endoscopy was                         accomplished without difficulty. The patient tolerated                         the procedure well. Findings:      The gastroesophageal junction and examined esophagus were normal.      Esophagogastric landmarks were identified: the gastroesophageal junction       was found at 36 cm from the incisors.      Striped moderately erythematous mucosa without bleeding was found in the       gastric antrum. Biopsies were taken with a cold forceps for histology.      The gastric body and incisura were normal. Biopsies were  taken with a       cold forceps for histology.      The cardia and gastric fundus were normal on retroflexion.      The duodenal bulb and second portion of the duodenum were normal. Impression:            - Normal gastroesophageal junction and esophagus.                        - Esophagogastric landmarks identified.                        - Erythematous mucosa in the antrum. Biopsied.                        - Normal gastric body and incisura. Biopsied.                        - Normal duodenal bulb and second portion of the                         duodenum. Recommendation:        - Await pathology results.                        - Follow an antireflux regimen.                        - Continue present medications. Procedure Code(s):     --- Professional ---                        [phone], Esophagogastroduodenoscopy, flexible,                         transoral; with biopsy, single or multiple Diagnosis Code(s):     --- Professional ---                        K31.89, Other diseases of stomach and duodenum                        F45.8, Other somatoform disorders CPT copyright 2022 American Medical Association. All rights reserved. The codes documented in this report are preliminary and upon coder review may  be revised to meet current compliance requirements. Dr. Corinn Brooklyn Corinn Jess Brooklyn MD, MD 11/27/2023 11:33:58 AM This report has  been signed electronically. Number of Addenda: 0 Note Initiated On: 11/27/2023 11:12 AM Estimated Blood Loss:  Estimated blood loss: none.      [facility]

## 2023-11-27 NOTE — Anesthesia Preprocedure Evaluation (Signed)
 Anesthesia Evaluation  Patient identified by MRN, date of birth, ID band Patient awake    Reviewed: Allergy & Precautions, NPO status , Patient's Chart, lab work & pertinent test results  History of Anesthesia Complications Negative for: history of anesthetic complications  Airway Mallampati: II  TM Distance: >3 FB Neck ROM: full    Dental no notable dental hx.    Pulmonary neg pulmonary ROS   Pulmonary exam normal        Cardiovascular hypertension, On Medications + Peripheral Vascular Disease  Normal cardiovascular exam     Neuro/Psych negative neurological ROS  negative psych ROS   GI/Hepatic Neg liver ROS,GERD  ,,  Endo/Other  negative endocrine ROS    Renal/GU negative Renal ROS  negative genitourinary   Musculoskeletal   Abdominal   Peds  Hematology negative hematology ROS (+)   Anesthesia Other Findings Past Medical History: No date: GERD (gastroesophageal reflux disease) No date: Hypertension No date: Peripheral arterial disease (HCC) No date: Thyroid  disease  Past Surgical History: No date: KNEE ARTHROSCOPY; Right 04/25/2021: KNEE ARTHROSCOPY WITH MEDIAL MENISECTOMY; Left     Comment:  Procedure: Left partial medial meniscectomies;  Surgeon:              Kathlynn Sharper, MD;  Location: [facility] ORS;  Service:               Orthopedics;  Laterality: Left; 04/25/2021: KNEE ARTHROSCOPY WITH SUBCHONDROPLASTY; Left     Comment:  Procedure: Left knee medial femoral subchondroplasty;                Surgeon: Kathlynn Sharper, MD;  Location: [facility] ORS;                Service: Orthopedics;  Laterality: Left; No date: ROTATOR CUFF REPAIR     Comment:  unsure site No date: TUBAL LIGATION  BMI    Body Mass Index: 28.17 kg/m      Reproductive/Obstetrics negative OB ROS                              Anesthesia Physical Anesthesia Plan  ASA: 2  Anesthesia Plan: General   Post-op Pain  Management: Minimal or no pain anticipated   Induction: Intravenous  PONV Risk Score and Plan: 2 and Propofol  infusion and TIVA  Airway Management Planned: Natural Airway and Nasal Cannula  Additional Equipment:   Intra-op Plan:   Post-operative Plan:   Informed Consent: I have reviewed the patients History and Physical, chart, labs and discussed the procedure including the risks, benefits and alternatives for the proposed anesthesia with the patient or authorized representative who has indicated his/her understanding and acceptance.     Dental Advisory Given  Plan Discussed with: Anesthesiologist, CRNA and Surgeon  Anesthesia Plan Comments: (Patient consented for risks of anesthesia including but not limited to:  - adverse reactions to medications - risk of airway placement if required - damage to eyes, teeth, lips or other oral mucosa - nerve damage due to positioning  - sore throat or hoarseness - Damage to heart, brain, nerves, lungs, other parts of body or loss of life  Patient voiced understanding and assent.)        Anesthesia Quick Evaluation

## 2023-11-27 NOTE — Anesthesia Postprocedure Evaluation (Signed)
 Anesthesia Post Note  Patient: Ann Crawford  Procedure(s) Performed: EGD (ESOPHAGOGASTRODUODENOSCOPY)  Patient location during evaluation: Endoscopy Anesthesia Type: General Level of consciousness: awake and alert Pain management: pain level controlled Vital Signs Assessment: post-procedure vital signs reviewed and stable Respiratory status: spontaneous breathing, nonlabored ventilation, respiratory function stable and patient connected to nasal cannula oxygen Cardiovascular status: blood pressure returned to baseline and stable Postop Assessment: no apparent nausea or vomiting Anesthetic complications: no   No notable events documented.   Last Vitals:  Vitals:   11/27/23 1146 11/27/23 1156  BP: 120/78 128/67  Pulse: 85 71  Resp: 18 20  Temp:    SpO2: 100% 98%    Last Pain:  Vitals:   11/27/23 1156  TempSrc:   PainSc: 0-No pain                 [name]

## 2023-11-27 NOTE — H&P (Signed)
 [name], MD [facility], [address]IP [address]  [place], [place] [address]  Main: [phone] Fax:  [phone] Pager: [phone]   Primary Care Physician:  Auston Reyes BIRCH, MD Primary Gastroenterologist:  Dr. [name]  Pre-Procedure History & Physical: HPI:  Ann Crawford is a 78 y.o. female is here for an endoscopy.   Past Medical History:  Diagnosis Date   GERD (gastroesophageal reflux disease)    Hypertension    Peripheral arterial disease (HCC)    Thyroid  disease     Past Surgical History:  Procedure Laterality Date   KNEE ARTHROSCOPY Right    KNEE ARTHROSCOPY WITH MEDIAL MENISECTOMY Left 04/25/2021   Procedure: Left partial medial meniscectomies;  Surgeon: Kathlynn Sharper, MD;  Location: [facility];  Service: Orthopedics;  Laterality: Left;   KNEE ARTHROSCOPY WITH SUBCHONDROPLASTY Left 04/25/2021   Procedure: Left knee medial femoral subchondroplasty;  Surgeon: Kathlynn Sharper, MD;  Location: [facility];  Service: Orthopedics;  Laterality: Left;   ROTATOR CUFF REPAIR     unsure site   TUBAL LIGATION      Prior to Admission medications   Medication Sig Start Date End Date Taking? Authorizing Provider  acetaminophen  (TYLENOL ) 325 MG tablet Take 650 mg by mouth 2 (two) times daily as needed for moderate pain.   Yes [provider]  Ascorbic Acid (VITAMIN C) 1000 MG tablet Take 1,000 mg by mouth 3 (three) times a week.   Yes [provider]  azelastine (ASTELIN) 0.1 % nasal spray Place 1 spray into both nostrils 2 (two) times daily as needed for rhinitis. Use in each nostril as directed   Yes [provider]  cetirizine  (ZYRTEC ) 10 MG tablet Take 1 tablet (10 mg total) by mouth daily. 05/20/21  Yes Covington, Sarah M, PA-C  Cholecalciferol (VITAMIN D3) 250 MCG (10000 UT) capsule Take 10,000 Units by mouth 3 (three) times a week.   Yes [provider]  Chromium 1000 MCG TABS Take 1,000 mcg by mouth  daily.   Yes [provider]  Cyanocobalamin (B-12) 5000 MCG CAPS Take 5,000 mcg by mouth daily.   Yes [provider]  ELDERBERRY PO Take 2-4 capsules by mouth daily as needed (immune support). gummies   Yes [provider]  fluticasone (FLONASE) 50 MCG/ACT nasal spray Place 2 sprays into both nostrils daily as needed for allergies.   Yes [provider]  ibuprofen (ADVIL) 200 MG tablet Take 400 mg by mouth every 6 (six) hours as needed for moderate pain.   Yes [provider]  levothyroxine (SYNTHROID) 75 MCG tablet Take 75 mcg by mouth daily before breakfast.   Yes [provider]  magic mouthwash (lidocaine , diphenhydrAMINE , alum & mag hydroxide) suspension Swish and spit 10 mLs 3 (three) times daily as needed for mouth pain. 05/20/21  Yes CovingtonLauraine M, PA-C  Magnesium Oxide 250 MG TABS Take 250 mg by mouth 2 (two) times daily.   Yes [provider]  Menaquinone-7 (VITAMIN K2) 100 MCG CAPS Take 100 mcg by mouth daily.   Yes [provider]  niacinamide 500 MG tablet Take 500 mg by mouth daily.   Yes [provider]  OVER THE COUNTER MEDICATION Take 2 Scoops by mouth daily. Collagen peptide powder   Yes [provider]  pantoprazole (PROTONIX) 20 MG tablet Take 20 mg by mouth 2 (two) times daily before a meal.   Yes [provider]  Probiotic Product (PROBIOTIC PO)  Take 3 capsules by mouth daily as needed (upset gi). [place]   Yes [provider]  Selenium 200 MCG CAPS Take 200 mcg by mouth daily.   Yes [provider]  vitamin E 1000 UNIT capsule Take 1,000 Units by mouth 2 (two) times a week.   Yes [provider]  Zinc 50 MG CAPS Take 50 mg by mouth 4 (four) times a week.   Yes [provider]  HYDROcodone -acetaminophen  (NORCO) 5-325 MG tablet Take 1 tablet by mouth every 6 (six) hours as needed for moderate pain. Patient not taking: Reported on 11/27/2023  04/25/21   Kathlynn Sharper, MD  metoprolol succinate (TOPROL-XL) 25 MG 24 hr tablet Take 25 mg by mouth daily. Patient not taking: Reported on 11/27/2023    [provider]    Allergies as of 11/12/2023 - Review Complete 05/20/2021  Allergen Reaction Noted   Dicyclomine Diarrhea 11/17/2019   Codeine Nausea And Vomiting 04/14/2021   Hydromet [hydrocodone  bit-homatrop mbr] Nausea And Vomiting 04/14/2021   Metoprolol Other (See Comments) 05/20/2021   Other  04/19/2021   Azithromycin  08/07/2018   Latex Rash 07/10/2013   Omeprazole Itching 12/09/2018   Tramadol hcl Other (See Comments) 04/20/2021    Family History  Problem Relation Age of Onset   Hypertension Mother    Varicose Veins Paternal Aunt    Breast cancer Neg Hx     Social History   Socioeconomic History   Marital status: Married    Spouse name: Not on file   Number of children: Not on file   Years of education: Not on file   Highest education level: Not on file  Occupational History   Not on file  Tobacco Use   Smoking status: Never   Smokeless tobacco: Never  Vaping Use   Vaping status: Never Used  Substance and Sexual Activity   Alcohol use: Yes   Drug use: Never   Sexual activity: Not on file  Other Topics Concern   Not on file  Social History Narrative   Not on file   Social Drivers of Health   Financial Resource Strain: Low Risk  (11/07/2023)   Received from [facility]   Overall Financial Resource Strain (CARDIA)    Difficulty of Paying Living Expenses: Not hard at all  Food Insecurity: No Food Insecurity (11/07/2023)   Received from [facility]   Hunger Vital Sign    Within the past 12 months, you worried that your food would run out before you got the money to buy more.: Never true    Within the past 12 months, the food you bought just didn't last and you didn't have money to get more.: Never true  Transportation Needs: No Transportation Needs (11/07/2023)    Received from [facility] - Transportation    In the past 12 months, has lack of transportation kept you from medical appointments or from getting medications?: No    Lack of Transportation (Non-Medical): No  Physical Activity: Not on file  Stress: Not on file  Social Connections: Not on file  Intimate Partner Violence: Not on file    Review of Systems: See HPI, otherwise negative ROS  Physical Exam: BP 125/72   Pulse 81   Temp (!) 96.4 F (35.8 C) (Temporal)   Resp 16   Ht 5' 2 (1.575 m)   Wt 69.9 kg   SpO2 100%   BMI 28.17 kg/m  General:  Alert,  pleasant and cooperative in NAD Head:  Normocephalic and atraumatic. Neck:  Supple; no masses or thyromegaly. Lungs:  Clear throughout to auscultation.    Heart:  Regular rate and rhythm. Abdomen:  Soft, nontender and nondistended. Normal bowel sounds, without guarding, and without rebound.   Neurologic:  Alert and  oriented x4;  grossly normal neurologically.  Impression/Plan: Ann Crawford is here for an endoscopy to be performed for globus sensation  Risks, benefits, limitations, and alternatives regarding upper endoscopy have been reviewed with the patient.  Questions have been answered.  All parties agreeable.   Corinn Brooklyn, MD  11/27/2023, 11:09 AM

## 2023-11-28 LAB — SURGICAL PATHOLOGY

## 2023-12-02 ENCOUNTER — Ambulatory Visit: Payer: Self-pay | Admitting: Gastroenterology

## 2024-02-07 ENCOUNTER — Other Ambulatory Visit: Payer: Self-pay | Admitting: Internal Medicine

## 2024-02-07 DIAGNOSIS — R0789 Other chest pain: Secondary | ICD-10-CM

## 2024-02-07 DIAGNOSIS — I10 Essential (primary) hypertension: Secondary | ICD-10-CM

## 2024-02-07 DIAGNOSIS — R9431 Abnormal electrocardiogram [ECG] [EKG]: Secondary | ICD-10-CM

## 2024-02-07 DIAGNOSIS — E7800 Pure hypercholesterolemia, unspecified: Secondary | ICD-10-CM

## 2024-02-21 ENCOUNTER — Ambulatory Visit
Admission: RE | Admit: 2024-02-21 | Discharge: 2024-02-21 | Disposition: A | Discharge status: Home / Self Care | Payer: Self-pay | Source: Ambulatory Visit | Attending: Internal Medicine | Admitting: Internal Medicine

## 2024-02-21 DIAGNOSIS — R0789 Other chest pain: Secondary | ICD-10-CM | POA: Insufficient documentation

## 2024-02-21 DIAGNOSIS — I10 Essential (primary) hypertension: Secondary | ICD-10-CM | POA: Insufficient documentation

## 2024-02-21 DIAGNOSIS — R9431 Abnormal electrocardiogram [ECG] [EKG]: Secondary | ICD-10-CM | POA: Insufficient documentation

## 2024-02-21 DIAGNOSIS — E7800 Pure hypercholesterolemia, unspecified: Secondary | ICD-10-CM | POA: Insufficient documentation
